# Patient Record
Sex: Female | Born: 1955
Health system: Southern US, Community
[De-identification: ages and names within clinical notes are randomized; demographics above are authoritative.]

## PROBLEM LIST (undated history)

## (undated) DIAGNOSIS — E119 Type 2 diabetes mellitus without complications: Secondary | ICD-10-CM

## (undated) DIAGNOSIS — E785 Hyperlipidemia, unspecified: Secondary | ICD-10-CM

## (undated) DIAGNOSIS — I10 Essential (primary) hypertension: Secondary | ICD-10-CM

## (undated) HISTORY — PX: LAPAROSCOPIC GASTRIC BANDING: SHX1100

## (undated) HISTORY — PX: ABDOMINAL HYSTERECTOMY: SHX81

## (undated) HISTORY — DX: Hyperlipidemia, unspecified: E78.5

## (undated) HISTORY — DX: Type 2 diabetes mellitus without complications: E11.9

## (undated) HISTORY — DX: Essential (primary) hypertension: I10

## (undated) HISTORY — PX: ROTATOR CUFF REPAIR: SHX139

---

## 2009-04-27 ENCOUNTER — Ambulatory Visit (HOSPITAL_COMMUNITY): Admission: RE | Admit: 2009-04-27 | Discharge: 2009-04-27 | Payer: Self-pay | Admitting: Surgery

## 2009-05-02 ENCOUNTER — Ambulatory Visit (HOSPITAL_COMMUNITY): Admission: RE | Admit: 2009-05-02 | Discharge: 2009-05-02 | Payer: Self-pay | Admitting: Surgery

## 2009-06-07 ENCOUNTER — Encounter: Admission: RE | Admit: 2009-06-07 | Discharge: 2009-06-07 | Payer: Self-pay | Admitting: Surgery

## 2009-08-25 ENCOUNTER — Encounter: Admission: RE | Admit: 2009-08-25 | Discharge: 2009-11-23 | Payer: Self-pay | Admitting: Surgery

## 2009-09-06 ENCOUNTER — Ambulatory Visit (HOSPITAL_COMMUNITY): Admission: RE | Admit: 2009-09-06 | Discharge: 2009-09-07 | Payer: Self-pay | Admitting: Surgery

## 2009-12-14 ENCOUNTER — Encounter: Admission: RE | Admit: 2009-12-14 | Discharge: 2010-03-14 | Payer: Self-pay | Admitting: Surgery

## 2010-03-15 ENCOUNTER — Encounter: Admission: RE | Admit: 2010-03-15 | Discharge: 2010-03-15 | Payer: Self-pay | Admitting: Surgery

## 2011-03-01 LAB — DIFFERENTIAL
Basophils Absolute: 0 10*3/uL (ref 0.0–0.1)
Basophils Relative: 0 % (ref 0–1)
Eosinophils Absolute: 0.1 10*3/uL (ref 0.0–0.7)
Eosinophils Relative: 1 % (ref 0–5)
Lymphocytes Relative: 32 % (ref 12–46)
Lymphocytes Relative: 34 % (ref 12–46)
Monocytes Relative: 7 % (ref 3–12)
Neutro Abs: 4.7 10*3/uL (ref 1.7–7.7)
Neutrophils Relative %: 58 % (ref 43–77)
Neutrophils Relative %: 59 % (ref 43–77)

## 2011-03-01 LAB — CBC
HCT: 38.2 % (ref 36.0–46.0)
MCHC: 34 g/dL (ref 30.0–36.0)
MCV: 83 fL (ref 78.0–100.0)
Platelets: 203 10*3/uL (ref 150–400)
Platelets: 249 10*3/uL (ref 150–400)
RBC: 4.61 MIL/uL (ref 3.87–5.11)
RBC: 4.94 MIL/uL (ref 3.87–5.11)
RDW: 14.1 % (ref 11.5–15.5)
WBC: 8.2 10*3/uL (ref 4.0–10.5)
WBC: 8.8 10*3/uL (ref 4.0–10.5)

## 2011-03-01 LAB — GLUCOSE, CAPILLARY
Glucose-Capillary: 130 mg/dL — ABNORMAL HIGH (ref 70–99)
Glucose-Capillary: 99 mg/dL (ref 70–99)

## 2011-03-01 LAB — COMPREHENSIVE METABOLIC PANEL
ALT: 41 U/L — ABNORMAL HIGH (ref 0–35)
Alkaline Phosphatase: 71 U/L (ref 39–117)
BUN: 24 mg/dL — ABNORMAL HIGH (ref 6–23)
CO2: 25 mEq/L (ref 19–32)
GFR calc Af Amer: >60 mL/min (ref >60)
GFR calc non Af Amer: >60 mL/min (ref >60)
Glucose, Bld: 83 mg/dL (ref 70–99)
Sodium: 136 mEq/L (ref 135–145)

## 2012-02-25 ENCOUNTER — Telehealth (INDEPENDENT_AMBULATORY_CARE_PROVIDER_SITE_OTHER): Payer: Self-pay | Admitting: Surgery

## 2012-02-25 NOTE — Telephone Encounter (Signed)
02/25/12 recall letter mailed to patient for bariatric surgery follow-up. Advised patient to call [facility] @ [phone] to schedule an appointment...cef 

## 2013-07-03 ENCOUNTER — Telehealth (INDEPENDENT_AMBULATORY_CARE_PROVIDER_SITE_OTHER): Payer: Self-pay | Admitting: Surgery

## 2013-07-03 NOTE — Telephone Encounter (Signed)
07/03/13 left message and mailed recall letter to make a bariatric follow-up appt with Dr. Ezzard Standing. Lap band placed on 09/06/09.ls

## 2013-08-25 ENCOUNTER — Encounter (INDEPENDENT_AMBULATORY_CARE_PROVIDER_SITE_OTHER): Payer: Self-pay

## 2015-11-29 DIAGNOSIS — H6091 Unspecified otitis externa, right ear: Secondary | ICD-10-CM | POA: Diagnosis not present

## 2015-11-29 DIAGNOSIS — L040 Acute lymphadenitis of face, head and neck: Secondary | ICD-10-CM | POA: Diagnosis not present

## 2015-11-29 DIAGNOSIS — Z6835 Body mass index (BMI) 35.0-35.9, adult: Secondary | ICD-10-CM | POA: Diagnosis not present

## 2015-11-29 MED FILL — AZITHROMYCIN 250 MG TABLET: 250 | 5 days supply | Qty: 6 | Fill #0

## 2015-12-01 MED FILL — NEO/POLYMYXIN/HC EAR SOLN: 3.5-10000-1 | 15 days supply | Qty: 10 | Fill #0

## 2015-12-08 DIAGNOSIS — T50905A Adverse effect of unspecified drugs, medicaments and biological substances, initial encounter: Secondary | ICD-10-CM | POA: Diagnosis not present

## 2015-12-08 DIAGNOSIS — Z6834 Body mass index (BMI) 34.0-34.9, adult: Secondary | ICD-10-CM | POA: Diagnosis not present

## 2015-12-09 MED FILL — LANTUS SOLOSTAR 100 UNITS/M: 100 | 78 days supply | Qty: 45 | Fill #2

## 2015-12-09 MED FILL — ULTICARE PEN NDL 8MM 31G: 31G X 8 MM | 90 days supply | Qty: 100 | Fill #0

## 2015-12-20 DIAGNOSIS — L404 Guttate psoriasis: Secondary | ICD-10-CM | POA: Diagnosis not present

## 2015-12-20 DIAGNOSIS — L821 Other seborrheic keratosis: Secondary | ICD-10-CM | POA: Diagnosis not present

## 2015-12-20 DIAGNOSIS — L579 Skin changes due to chronic exposure to nonionizing radiation, unspecified: Secondary | ICD-10-CM | POA: Diagnosis not present

## 2015-12-20 DIAGNOSIS — L814 Other melanin hyperpigmentation: Secondary | ICD-10-CM | POA: Diagnosis not present

## 2016-01-19 MED FILL — metFORMIN HCL 500 MG TABS: 500 | 90 days supply | Qty: 90 | Fill #0

## 2016-01-19 MED FILL — CARVEDILOL 12.5 MG TABLET: 12.5 | 90 days supply | Qty: 180 | Fill #0

## 2016-01-19 MED FILL — INVOKANA 300 MG TABLET: 300 | 30 days supply | Qty: 30 | Fill #2

## 2016-02-01 DIAGNOSIS — H90A31 Mixed conductive and sensorineural hearing loss, unilateral, right ear with restricted hearing on the contralateral side: Secondary | ICD-10-CM | POA: Diagnosis not present

## 2016-02-01 DIAGNOSIS — B369 Superficial mycosis, unspecified: Secondary | ICD-10-CM | POA: Diagnosis not present

## 2016-02-01 DIAGNOSIS — H90A22 Sensorineural hearing loss, unilateral, left ear, with restricted hearing on the contralateral side: Secondary | ICD-10-CM | POA: Diagnosis not present

## 2016-02-01 DIAGNOSIS — H608X3 Other otitis externa, bilateral: Secondary | ICD-10-CM | POA: Diagnosis not present

## 2016-02-01 DIAGNOSIS — H9071 Mixed conductive and sensorineural hearing loss, unilateral, right ear, with unrestricted hearing on the contralateral side: Secondary | ICD-10-CM | POA: Diagnosis not present

## 2016-02-06 DIAGNOSIS — B029 Zoster without complications: Secondary | ICD-10-CM | POA: Diagnosis not present

## 2016-02-06 DIAGNOSIS — Z6835 Body mass index (BMI) 35.0-35.9, adult: Secondary | ICD-10-CM | POA: Diagnosis not present

## 2016-02-06 MED FILL — valACYclovir HCL 1 GM TABS: 1 | 7 days supply | Qty: 21 | Fill #0

## 2016-03-26 MED FILL — ULTICARE PEN NDL 8MM 31G: 31G X 8 MM | 90 days supply | Qty: 100 | Fill #1

## 2016-03-26 MED FILL — LANTUS SOLOSTAR 100 UNITS/M: 100 | 78 days supply | Qty: 45 | Fill #3

## 2016-03-29 ENCOUNTER — Other Ambulatory Visit: Payer: Self-pay | Admitting: Surgery

## 2016-03-29 ENCOUNTER — Ambulatory Visit
Admission: RE | Admit: 2016-03-29 | Discharge: 2016-03-29 | Disposition: A | Discharge status: Home / Self Care | Payer: 59 | Source: Ambulatory Visit | Attending: Surgery | Admitting: Surgery

## 2016-03-29 DIAGNOSIS — R1013 Epigastric pain: Secondary | ICD-10-CM | POA: Diagnosis not present

## 2016-03-29 DIAGNOSIS — Z4651 Encounter for fitting and adjustment of gastric lap band: Secondary | ICD-10-CM

## 2016-03-29 DIAGNOSIS — Z9884 Bariatric surgery status: Secondary | ICD-10-CM | POA: Diagnosis not present

## 2016-04-18 MED FILL — INVOKANA 300 MG TABLET: 300 | 30 days supply | Qty: 30 | Fill #3

## 2016-04-19 MED FILL — RAMIPRIL 10 MG CAPSULE: 10 | 90 days supply | Qty: 180 | Fill #0

## 2016-04-19 MED FILL — metFORMIN HCL 500 MG TABS: 500 | 90 days supply | Qty: 90 | Fill #0

## 2016-04-26 ENCOUNTER — Encounter: Payer: 59 | Attending: Surgery | Admitting: Dietician

## 2016-04-26 ENCOUNTER — Encounter: Payer: Self-pay | Admitting: Dietician

## 2016-04-26 VITALS — Ht 62.0 in | Wt 185.6 lb

## 2016-04-26 DIAGNOSIS — E669 Obesity, unspecified: Secondary | ICD-10-CM

## 2016-04-26 DIAGNOSIS — Z9884 Bariatric surgery status: Secondary | ICD-10-CM | POA: Diagnosis not present

## 2016-04-26 DIAGNOSIS — Z713 Dietary counseling and surveillance: Secondary | ICD-10-CM | POA: Insufficient documentation

## 2016-04-26 NOTE — Progress Notes (Signed)
Medical Nutrition Therapy:  Appt start time: 0800 end time:  0840.   Assessment:  Primary concerns today: Melanie Sweeney is here today since she had the lap band in 2010. Lost down to 160 lbs (about 40 lbs). Current weight is 185.6 lbs and started gaining weight back when she started eating "badly" a few years ago. Husband likes to keep a lot of junk food around. Stopped getting follow ups with [facility] about 5 years ago. Had a problem with reflux recently after a stomach bug. Drinks a lot of coffee. Still feels restriction in her lap band and will throw up if she eats too much. Has another follow up with Dr. Lucia Gaskins in July and did not get a fill recently. Has a strong hx of heart disease which she would like to avoid.   Since meeting with Dr. Lucia Gaskins has started walk and follow 56 day diet plan (low carb) about 2 weeks ago.Has lost about 6 lbs since starting diet plan. Has cut out chocolate, sweets, bread, pasta, and diet Dr. Malachi Bonds.   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 Lives with her husband. Not skipping meals at this point. Eats out about 1 x week.   Needs to stay away from bread and shredded BBQ meat to avoid issues with lap band. Feels hungriest between 1 and 3 PM. Not hungry in the morning. Feels like she chews well. Just started back to waiting 30 minutes to drink about after meals. Not taking vitamins.   Preferred Learning Style:   No preference indicated   Learning Readiness:   Ready   MEDICATIONS: see list   DIETARY INTAKE:  Usual eating pattern includes 3 meals and 2-3 snacks per day.  Avoided foods include: liver, hot dogs, shrimp, lamb  24-hr recall:  B ( AM): Atkins shake and apple and sometimes with 4 pieces of [place] bacon Snk ( AM): apple with PB2 or unsweet applesauce or triple zero yogurt L ( PM): salad mix sometimes with with chicken or quinoa Snk ( PM): 28 almonds D ( PM): hamburger, chicken, or fish with quinoa and tomatoes or green beans Snk ( PM): none or yogurt Beverages:  2-3 cups coffee with stevia and spoonful of coconut oil, 60 oz+water, unsweet tea (trying to get 64 oz of water)  Usual physical activity: walking 5-6 x week for about 20 minutes  Estimated energy needs: 1200 calories  Progress Towards Goal(s):  In progress.   Nutritional Diagnosis:  [place]-3.3 Overweight/obesity related to past poor dietary habits and physical inactivity as evidenced by patient w/hx of LAGB surgery following dietary guidelines for continued weight loss.    Intervention:  Nutrition counseling provided. Plan Other sugar free drink ideas: Twist, crystal light, flavor drops, Powerade Zero, Diet juices - Ocean Spray or V8 Splash (says Diet and 5-10 calories). Eat protein first at eat meal - about 3 oz/size of deck of cards or palm of your hand.  Next each non starchy vegetables (as much as you want). Keep carbs to minimum (eat after protein and vegetables). 1/2 cup quinoa Continue to have fruit with protein (nuts, PB2, yogurt) for snack if you are hungry. Continue to walk as tolerated. Look into another exercise option if hip hurts.([facility])  Continue working on waiting 30 minutes to drink after meals. Start taking a chewable Flintstones Complete Multivitamin. Look into taking a chewable calcium and Vitamin D (1000 IUs). Have doctor check vitamin to see if you need the megadose.  Add biotin or hair, skin,  nails vitamin to help with hair.      Teaching Method Utilized:  Visual Auditory Hands on  Handouts given during visit include:  Protein shakes  Barriers to learning/adherence to lifestyle change: none  Demonstrated degree of understanding via:  Teach Back   Monitoring/Evaluation:  Dietary intake, exercise, and body weight in 2 month(s).

## 2016-04-26 NOTE — Patient Instructions (Addendum)
Other sugar free drink ideas: Twist, crystal light, flavor drops, Powerade Zero, Diet juices - Ocean Spray or V8 Splash (says Diet and 5-10 calories). Eat protein first at eat meal - about 3 oz/size of deck of cards or palm of your hand.  Next each non starchy vegetables (as much as you want). Keep carbs to minimum (eat after protein and vegetables). 1/2 cup quinoa Continue to have fruit with protein (nuts, PB2, yogurt) for snack if you are hungry. Continue to walk as tolerated. Look into another exercise option if hip hurts.([facility])  Continue working on waiting 30 minutes to drink after meals. Start taking a chewable Flintstones Complete Multivitamin. Look into taking a chewable calcium and Vitamin D (1000 IUs). Have doctor check vitamin to see if you need the megadose.  Add biotin or hair, skin, nails vitamin to help with hair.

## 2016-05-02 DIAGNOSIS — E1165 Type 2 diabetes mellitus with hyperglycemia: Secondary | ICD-10-CM | POA: Diagnosis not present

## 2016-05-02 DIAGNOSIS — Z794 Long term (current) use of insulin: Secondary | ICD-10-CM | POA: Diagnosis not present

## 2016-05-02 DIAGNOSIS — E559 Vitamin D deficiency, unspecified: Secondary | ICD-10-CM | POA: Diagnosis not present

## 2016-05-03 MED FILL — ONE TOUCH DELICA 33G LANCET: 50 days supply | Qty: 100 | Fill #0

## 2016-05-03 MED FILL — ONE TOUCH ULTRA 2 GLUCOSE S: W/DEVICE | 30 days supply | Qty: 1 | Fill #0

## 2016-05-04 MED FILL — VIT D2 1.25 MG (50,000 UNIT: 1.25 MG | 56 days supply | Qty: 8 | Fill #0

## 2016-05-24 ENCOUNTER — Emergency Department (INDEPENDENT_AMBULATORY_CARE_PROVIDER_SITE_OTHER): Payer: 59

## 2016-05-24 ENCOUNTER — Encounter: Payer: Self-pay | Admitting: *Deleted

## 2016-05-24 ENCOUNTER — Emergency Department
Admission: EM | Admit: 2016-05-24 | Discharge: 2016-05-24 | Disposition: A | Discharge status: Home / Self Care | Payer: 59 | Source: Home / Self Care

## 2016-05-24 DIAGNOSIS — M79672 Pain in left foot: Secondary | ICD-10-CM | POA: Diagnosis not present

## 2016-05-24 DIAGNOSIS — S9032XA Contusion of left foot, initial encounter: Secondary | ICD-10-CM

## 2016-05-24 DIAGNOSIS — M25475 Effusion, left foot: Secondary | ICD-10-CM | POA: Diagnosis not present

## 2016-05-24 DIAGNOSIS — M7989 Other specified soft tissue disorders: Secondary | ICD-10-CM | POA: Diagnosis not present

## 2016-05-24 NOTE — Discharge Instructions (Signed)
Apply ice pack for 20 to 30 minutes every 2 to 3 hours today and tomorrow.  Elevate.   Wear Ace wrap until swelling decreases.  Wear brace for about 2 to 3 weeks.  May take Aleve, 2 tabs every 12 hours with food.   Foot Contusion A foot contusion is a deep bruise to the foot. Contusions are the result of an injury that caused bleeding under the skin. The contusion may turn blue, purple, or yellow. Minor injuries will give you a painless contusion, but more severe contusions may stay painful and swollen for a few weeks. CAUSES  A foot contusion comes from a direct blow to that area, such as a heavy object falling on the foot. SYMPTOMS   Swelling of the foot.  Discoloration of the foot.  Tenderness or soreness of the foot. DIAGNOSIS  You will have a physical exam and will be asked about your history. You may need an X-ray of your foot to look for a broken bone (fracture).  TREATMENT  An elastic wrap may be recommended to support your foot. Resting, elevating, and applying cold compresses to your foot are often the best treatments for a foot contusion. Over-the-counter medicines may also be recommended for pain control. HOME CARE INSTRUCTIONS   Put ice on the injured area.  Put ice in a plastic bag.  Place a towel between your skin and the bag.  Leave the ice on for 15-20 minutes, 03-04 times a day.  Only take over-the-counter or prescription medicines for pain, discomfort, or fever as directed by your caregiver.  If told, use an elastic wrap as directed. This can help reduce swelling. You may remove the wrap for sleeping, showering, and bathing. If your toes become numb, cold, or blue, take the wrap off and reapply it more loosely.  Elevate your foot with pillows to reduce swelling.  Try to avoid standing or walking while the foot is painful. Do not resume use until instructed by your caregiver. Then, begin use gradually. If pain develops, decrease use. Gradually increase activities  that do not cause discomfort until you have normal use of your foot.  See your caregiver as directed. It is very important to keep all follow-up appointments in order to avoid any lasting problems with your foot, including long-term (chronic) pain. SEEK IMMEDIATE MEDICAL CARE IF:   You have increased redness, swelling, or pain in your foot.  Your swelling or pain is not relieved with medicines.  You have loss of feeling in your foot or are unable to move your toes.  Your foot turns cold or blue.  You have pain when you move your toes.  Your foot becomes warm to the touch.  Your contusion does not improve in 2 days. MAKE SURE YOU:   Understand these instructions.  Will watch your condition.  Will get help right away if you are not doing well or get worse.   This information is not intended to replace advice given to you by your health care provider. Make sure you discuss any questions you have with your health care provider.   Document Released: 09/03/2006 Document Revised: 05/13/2012 Document Reviewed: 07/19/2015 Elsevier Interactive Patient Education [employer].

## 2016-05-24 NOTE — ED Notes (Signed)
Pt dropped jar of sauce on left foot last night. Pain @ base of great toe, redness/swelling. Previous injury to another part of her left foot.

## 2016-05-24 NOTE — ED Provider Notes (Signed)
CSN: [id number]     Arrival date & time 05/24/16  1652 History   None    Chief Complaint  Patient presents with  . Foot Injury      HPI Comments: Patient dropped a jar of sauce on the dorsum of her left foot last night, and has had persistent pain.  Patient is a 60 y.o. female presenting with foot injury. The history is provided by the patient.  Foot Injury Location:  Foot Time since incident:  1 day Injury: yes   Mechanism of injury comment:  Contusion Foot location:  L foot Pain details:    Quality:  Aching   Radiates to:  Does not radiate   Severity:  Moderate   Onset quality:  Sudden   Duration:  1 day   Timing:  Constant   Progression:  Unchanged Chronicity:  New Prior injury to area:  Yes Relieved by:  None tried Worsened by:  Bearing weight Ineffective treatments:  None tried Associated symptoms: stiffness and swelling   Associated symptoms: no decreased ROM, no muscle weakness, no numbness and no tingling     Past Medical History  Diagnosis Date  . Diabetes mellitus without complication ([place])   . Hypertension   . Hyperlipidemia    Past Surgical History  Procedure Laterality Date  . Laparoscopic gastric banding    . Abdominal hysterectomy    . Rotator cuff repair     Family History  Problem Relation Age of Onset  . Hypertension Mother   . Diabetes Mother   . Stroke Mother   . Diabetes Father   . Hypertension Father   . Cancer Father     Lung Ca  . Lymphoma Father    Social History  Substance Use Topics  . Smoking status: Never Smoker   . Smokeless tobacco: None  . Alcohol Use: No   OB History    No data available     Review of Systems  Musculoskeletal: Positive for stiffness.    Allergies  Iodine and Shrimp  Home Medications   Prior to Admission medications   Medication Sig Start Date End Date Taking? Authorizing Provider  canagliflozin (INVOKANA) 300 MG TABS tablet Take 300 mg by mouth daily before breakfast.    Historical Provider,  MD  carvedilol (COREG) 12.5 MG tablet Take 12.5 mg by mouth 2 (two) times daily with a meal.    Historical Provider, MD  insulin glargine (LANTUS) 100 UNIT/ML injection Inject into the skin at bedtime.    Historical Provider, MD  ramipril (ALTACE) 10 MG capsule Take 10 mg by mouth daily.    Historical Provider, MD  rosuvastatin (CRESTOR) 10 MG tablet Take 10 mg by mouth daily.    Historical Provider, MD   Meds Ordered and Administered this Visit  Medications - No data to display  BP 170/71 mmHg  Pulse 75  Ht 5\' 2"  (1.575 m)  Wt 186 lb (84.369 kg)  BMI 34.01 kg/m2  SpO2 98% No data found.   Physical Exam  Constitutional: She is oriented to person, place, and time. She appears well-developed and well-nourished. No distress.  HENT:  Head: Atraumatic.  Eyes: Conjunctivae are normal. Pupils are equal, round, and reactive to light.  Neck: Neck supple.  Cardiovascular: Normal heart sounds.   Pulmonary/Chest: Breath sounds normal.  Musculoskeletal:       Left foot: There is tenderness, bony tenderness and swelling. There is normal capillary refill, no crepitus, no deformity and no laceration.  Feet:  Left foot has tenderness to palpation over dorsum  as noted on diagram.    Neurological: She is alert and oriented to person, place, and time.  Skin: Skin is warm and dry.  Nursing note and vitals reviewed.   ED Course  Procedures none  Imaging Review Dg Foot Complete Left  05/24/2016  CLINICAL DATA:  Initial encounter. 60 y/o female s/p injury to LEFT foot last night, dropped a can of food on it. Intermittent pain at distal 1st metatarsal/proximal phalanx of great toe. EXAM: LEFT FOOT - COMPLETE 3+ VIEW COMPARISON:  None. FINDINGS: No fracture or dislocation of mid foot or forefoot. The phalanges are normal. The calcaneus is normal. Soft tissue swelling over the dorsum of foot. IMPRESSION: 1. No evidence fracture. 2. Soft tissue swelling over the dorsum foot. Electronically Signed    By: Suzy Bouchard M.D.   On: 05/24/2016 17:34      MDM   1. Contusion of left foot, initial encounter    Ace wrap applied. Apply ice pack for 20 to 30 minutes every 2 to 3 hours today and tomorrow.  Elevate.   Wear Ace wrap until swelling decreases.  Wear brace for about 2 to 3 weeks.  May take Aleve, 2 tabs every 12 hours with food. Followup with Dr. Aundria Mems or Dr. Lynne Leader ([facility]) if not improving about two weeks.     Kandra Nicolas, MD 05/26/16 [phone]

## 2016-06-01 DIAGNOSIS — Z9884 Bariatric surgery status: Secondary | ICD-10-CM | POA: Diagnosis not present

## 2016-06-26 ENCOUNTER — Ambulatory Visit: Payer: 59 | Admitting: Dietician

## 2016-06-29 DIAGNOSIS — E782 Mixed hyperlipidemia: Secondary | ICD-10-CM | POA: Diagnosis not present

## 2016-06-29 DIAGNOSIS — Z1159 Encounter for screening for other viral diseases: Secondary | ICD-10-CM | POA: Diagnosis not present

## 2016-06-29 DIAGNOSIS — M810 Age-related osteoporosis without current pathological fracture: Secondary | ICD-10-CM | POA: Diagnosis not present

## 2016-06-29 DIAGNOSIS — I10 Essential (primary) hypertension: Secondary | ICD-10-CM | POA: Diagnosis not present

## 2016-06-29 DIAGNOSIS — Z1231 Encounter for screening mammogram for malignant neoplasm of breast: Secondary | ICD-10-CM | POA: Diagnosis not present

## 2016-06-29 DIAGNOSIS — Z23 Encounter for immunization: Secondary | ICD-10-CM | POA: Diagnosis not present

## 2016-07-05 ENCOUNTER — Ambulatory Visit: Payer: 59 | Admitting: Dietician

## 2016-07-06 MED FILL — RAMIPRIL 10 MG CAPSULE: 10 | 90 days supply | Qty: 90 | Fill #0

## 2016-07-06 MED FILL — CARVEDILOL 12.5 MG TABLET: 12.5 | 90 days supply | Qty: 90 | Fill #0

## 2016-07-09 MED FILL — LANTUS SOLOSTAR 100 UNITS/M: 100 | 86 days supply | Qty: 36 | Fill #0

## 2016-08-03 MED FILL — METFORMIN HCL ER 500 MG TAB: 500 | 90 days supply | Qty: 90 | Fill #0

## 2016-08-15 DIAGNOSIS — M8589 Other specified disorders of bone density and structure, multiple sites: Secondary | ICD-10-CM | POA: Diagnosis not present

## 2016-08-15 DIAGNOSIS — M899 Disorder of bone, unspecified: Secondary | ICD-10-CM | POA: Diagnosis not present

## 2016-08-30 MED FILL — ALENDRONATE NA 70 MG TAB: 70 | 84 days supply | Qty: 12 | Fill #0

## 2016-09-28 MED FILL — LANTUS SOLOSTAR 100 UNITS/M: 100 | 86 days supply | Qty: 36 | Fill #1

## 2016-11-08 MED FILL — METFORMIN HCL ER 500 MG TAB: 500 | 90 days supply | Qty: 90 | Fill #1

## 2016-11-23 MED FILL — INVOKANA 300 MG TABLET: 300 | 30 days supply | Qty: 30 | Fill #0

## 2016-12-05 DIAGNOSIS — J40 Bronchitis, not specified as acute or chronic: Secondary | ICD-10-CM | POA: Diagnosis not present

## 2016-12-06 MED FILL — VENTOLIN HFA 90 MCG INHALER: 108 (90 BAS | 17 days supply | Qty: 18 | Fill #0

## 2016-12-10 MED FILL — LANTUS SOLOSTAR 100 UNITS/M: 100 | 43 days supply | Qty: 18 | Fill #2

## 2016-12-11 MED FILL — AMOX-CLAV 875-125 MG TABLET: 875-125 | 10 days supply | Qty: 20 | Fill #0

## 2016-12-27 MED FILL — INVOKANA 300 MG TABLET: 300 | 30 days supply | Qty: 30 | Fill #1

## 2017-01-07 DIAGNOSIS — Z794 Long term (current) use of insulin: Secondary | ICD-10-CM | POA: Diagnosis not present

## 2017-01-07 DIAGNOSIS — E559 Vitamin D deficiency, unspecified: Secondary | ICD-10-CM | POA: Diagnosis not present

## 2017-01-07 DIAGNOSIS — Z6835 Body mass index (BMI) 35.0-35.9, adult: Secondary | ICD-10-CM | POA: Diagnosis not present

## 2017-01-07 DIAGNOSIS — E6609 Other obesity due to excess calories: Secondary | ICD-10-CM | POA: Diagnosis not present

## 2017-01-07 DIAGNOSIS — E1165 Type 2 diabetes mellitus with hyperglycemia: Secondary | ICD-10-CM | POA: Diagnosis not present

## 2017-01-10 DIAGNOSIS — D1801 Hemangioma of skin and subcutaneous tissue: Secondary | ICD-10-CM | POA: Diagnosis not present

## 2017-01-10 DIAGNOSIS — L814 Other melanin hyperpigmentation: Secondary | ICD-10-CM | POA: Diagnosis not present

## 2017-01-10 DIAGNOSIS — D225 Melanocytic nevi of trunk: Secondary | ICD-10-CM | POA: Diagnosis not present

## 2017-01-10 DIAGNOSIS — L821 Other seborrheic keratosis: Secondary | ICD-10-CM | POA: Diagnosis not present

## 2017-01-10 DIAGNOSIS — L579 Skin changes due to chronic exposure to nonionizing radiation, unspecified: Secondary | ICD-10-CM | POA: Diagnosis not present

## 2017-01-10 DIAGNOSIS — L400 Psoriasis vulgaris: Secondary | ICD-10-CM | POA: Diagnosis not present

## 2017-01-10 DIAGNOSIS — D2371 Other benign neoplasm of skin of right lower limb, including hip: Secondary | ICD-10-CM | POA: Diagnosis not present

## 2017-01-10 MED FILL — DESONIDE 0.05% OINTMENT: 0.05 | 20 days supply | Qty: 30 | Fill #0

## 2017-01-15 MED FILL — ATORVASTATIN 20 MG TABLET: 20 | 35 days supply | Qty: 15 | Fill #0

## 2017-01-22 DIAGNOSIS — I10 Essential (primary) hypertension: Secondary | ICD-10-CM | POA: Diagnosis not present

## 2017-01-22 DIAGNOSIS — E785 Hyperlipidemia, unspecified: Secondary | ICD-10-CM | POA: Diagnosis not present

## 2017-01-22 DIAGNOSIS — Z23 Encounter for immunization: Secondary | ICD-10-CM | POA: Diagnosis not present

## 2017-01-28 MED FILL — LANTUS SOLOSTAR 100 UNITS/M: 100 | 36 days supply | Qty: 15 | Fill #0

## 2017-01-28 MED FILL — METFORMIN HCL ER 500 MG TAB: 500 | 90 days supply | Qty: 90 | Fill #2

## 2017-01-28 MED FILL — INVOKANA 300 MG TABLET: 300 | 30 days supply | Qty: 30 | Fill #2

## 2017-02-26 DIAGNOSIS — Z1231 Encounter for screening mammogram for malignant neoplasm of breast: Secondary | ICD-10-CM | POA: Diagnosis not present

## 2017-02-28 MED FILL — LANTUS SOLOSTAR 100 UNITS/M: 100 | 36 days supply | Qty: 15 | Fill #1

## 2017-02-28 MED FILL — INVOKANA 300 MG TABLET: 300 | 30 days supply | Qty: 30 | Fill #3

## 2017-03-27 MED FILL — INVOKANA 300 MG TABLET: 300 | 30 days supply | Qty: 30 | Fill #4

## 2017-04-09 MED FILL — LANTUS SOLOSTAR 100 UNITS/M: 100 | 36 days supply | Qty: 15 | Fill #2

## 2017-04-10 MED FILL — RAMIPRIL 10 MG CAPSULE: 10 | 90 days supply | Qty: 90 | Fill #0

## 2017-04-10 MED FILL — CARVEDILOL 12.5 MG TABLET: 12.5 | 90 days supply | Qty: 90 | Fill #0

## 2017-04-24 MED FILL — METFORMIN HCL ER 500 MG TAB: 500 | 90 days supply | Qty: 90 | Fill #3

## 2017-04-26 MED FILL — INVOKANA 300 MG TABLET: 300 | 30 days supply | Qty: 30 | Fill #5

## 2017-05-16 MED FILL — LANTUS SOLOSTAR 100 UNITS/M: 100 | 36 days supply | Qty: 15 | Fill #3

## 2017-05-30 MED FILL — INVOKANA 300 MG TABLET: 300 | 30 days supply | Qty: 30 | Fill #0

## 2017-06-13 DIAGNOSIS — L600 Ingrowing nail: Secondary | ICD-10-CM | POA: Diagnosis not present

## 2017-06-13 DIAGNOSIS — M21612 Bunion of left foot: Secondary | ICD-10-CM | POA: Diagnosis not present

## 2017-06-13 DIAGNOSIS — M21611 Bunion of right foot: Secondary | ICD-10-CM | POA: Diagnosis not present

## 2017-06-13 MED FILL — SULFAMETHOXAZOLE/TMP DS TAB: 800-160 | 10 days supply | Qty: 20 | Fill #0

## 2017-06-14 MED FILL — ULTICARE PEN NDL 8MM 31G: 31G X 8 MM | 90 days supply | Qty: 100 | Fill #0

## 2017-06-20 MED FILL — LANTUS SOLOSTAR 100 UNITS/M: 100 | 36 days supply | Qty: 15 | Fill #4

## 2017-06-26 ENCOUNTER — Ambulatory Visit: Payer: Self-pay | Admitting: Podiatry

## 2017-07-02 ENCOUNTER — Ambulatory Visit: Payer: Self-pay | Admitting: Podiatry

## 2017-07-03 MED FILL — CARVEDILOL 12.5 MG TABLET: 12.5 | 90 days supply | Qty: 90 | Fill #1

## 2017-07-03 MED FILL — RAMIPRIL 10 MG CAPSULE: 10 | 90 days supply | Qty: 90 | Fill #1

## 2017-07-03 MED FILL — INVOKANA 300 MG TABLET: 300 | 30 days supply | Qty: 30 | Fill #1

## 2017-07-16 DIAGNOSIS — L821 Other seborrheic keratosis: Secondary | ICD-10-CM | POA: Diagnosis not present

## 2017-07-16 DIAGNOSIS — D492 Neoplasm of unspecified behavior of bone, soft tissue, and skin: Secondary | ICD-10-CM | POA: Diagnosis not present

## 2017-07-16 DIAGNOSIS — L814 Other melanin hyperpigmentation: Secondary | ICD-10-CM | POA: Diagnosis not present

## 2017-07-16 DIAGNOSIS — R21 Rash and other nonspecific skin eruption: Secondary | ICD-10-CM | POA: Diagnosis not present

## 2017-07-16 DIAGNOSIS — L2089 Other atopic dermatitis: Secondary | ICD-10-CM | POA: Diagnosis not present

## 2017-07-16 DIAGNOSIS — L579 Skin changes due to chronic exposure to nonionizing radiation, unspecified: Secondary | ICD-10-CM | POA: Diagnosis not present

## 2017-07-16 MED FILL — FLUOCINONIDE 0.05% CREAM: 0.05 | 14 days supply | Qty: 30 | Fill #0

## 2017-07-17 ENCOUNTER — Ambulatory Visit (INDEPENDENT_AMBULATORY_CARE_PROVIDER_SITE_OTHER): Payer: 59 | Admitting: Podiatry

## 2017-07-17 ENCOUNTER — Encounter: Payer: Self-pay | Admitting: Podiatry

## 2017-07-17 VITALS — Ht 62.0 in | Wt 178.0 lb

## 2017-07-17 DIAGNOSIS — M2041 Other hammer toe(s) (acquired), right foot: Secondary | ICD-10-CM

## 2017-07-17 DIAGNOSIS — M67472 Ganglion, left ankle and foot: Secondary | ICD-10-CM

## 2017-07-17 DIAGNOSIS — M79671 Pain in right foot: Secondary | ICD-10-CM | POA: Diagnosis not present

## 2017-07-17 DIAGNOSIS — M79672 Pain in left foot: Secondary | ICD-10-CM

## 2017-07-17 NOTE — Patient Instructions (Signed)
Pain 4th toe right. Noted of curling under the 3rd digit right causing pain with weight bearing. May require surgical intervention. Noted of enlarged ganglionic cyst left foot dorsum. May benefit from compression. Metatarsal binder x 1, medium dispensed. Return for pre op.

## 2017-07-17 NOTE — Progress Notes (Signed)
SUBJECTIVE: 61 y.o. year old female presents complaining of painful toe 3rd right. Soft tissue mass over left foot due to injury 7 years ago. Has had multiple ankle sprain bilateral. Diabetic x 22 years. Blood sugar 94 yesterday. A1C was 7 on 06/18/17.  REVIEW OF SYSTEMS: A comprehensive review of systems was negative except for: Diabetic x 22 years, controlled.  OBJECTIVE: DERMATOLOGIC EXAMINATION: No abnormal findings.  VASCULAR EXAMINATION OF LOWER LIMBS: All pedal pulses are palpable with normal pulsation.  Capillary Filling times within 3 seconds in all digits.  No edema or erythema noted. Temperature gradient from tibial crest to dorsum of foot is within normal bilateral.  NEUROLOGIC EXAMINATION OF THE LOWER LIMBS: All epicritic and tactile sensations grossly intact. Sharp and Dull discriminatory sensations at the plantar ball of hallux is intact bilateral.   MUSCULOSKELETAL EXAMINATION: Positive for hypermobile first ray, enlarged metatarsal head, contracting lesser digits with varus rotated 4th toe right.  ASSESSMENT: Digital deformity, varus rotated 4th digit curling under the 3rd toe upon weight bearing. Bunion deformity bilateral. Hypermobile first ray bilateral. Ganglionic cyst dorsum left foot.  PLAN: Reviewed findings and available treatment options. Metatarsal binder dispensed for left foot cyst. May benefit from surgical intervention on 4th toe right. Return for pre op.

## 2017-07-25 MED FILL — LANTUS SOLOSTAR 100 UNITS/M: 100 | 36 days supply | Qty: 15 | Fill #5

## 2017-07-30 MED FILL — METFORMIN HCL ER 500 MG TAB: 500 | 90 days supply | Qty: 90 | Fill #0

## 2017-08-01 MED FILL — INVOKANA 300 MG TABLET: 300 | 30 days supply | Qty: 30 | Fill #2

## 2017-08-13 DIAGNOSIS — R319 Hematuria, unspecified: Secondary | ICD-10-CM | POA: Diagnosis not present

## 2017-08-13 DIAGNOSIS — N390 Urinary tract infection, site not specified: Secondary | ICD-10-CM | POA: Diagnosis not present

## 2017-08-13 MED FILL — CIPROFLOXACIN HCL 500 MG TA: 500 | 10 days supply | Qty: 20 | Fill #0

## 2017-08-23 DIAGNOSIS — R3121 Asymptomatic microscopic hematuria: Secondary | ICD-10-CM | POA: Diagnosis not present

## 2017-08-23 DIAGNOSIS — E559 Vitamin D deficiency, unspecified: Secondary | ICD-10-CM | POA: Diagnosis not present

## 2017-08-23 DIAGNOSIS — I10 Essential (primary) hypertension: Secondary | ICD-10-CM | POA: Diagnosis not present

## 2017-08-23 DIAGNOSIS — Z23 Encounter for immunization: Secondary | ICD-10-CM | POA: Diagnosis not present

## 2017-08-23 DIAGNOSIS — Z6834 Body mass index (BMI) 34.0-34.9, adult: Secondary | ICD-10-CM | POA: Diagnosis not present

## 2017-08-23 DIAGNOSIS — E782 Mixed hyperlipidemia: Secondary | ICD-10-CM | POA: Diagnosis not present

## 2017-08-23 DIAGNOSIS — R5383 Other fatigue: Secondary | ICD-10-CM | POA: Diagnosis not present

## 2017-08-23 DIAGNOSIS — Z0000 Encounter for general adult medical examination without abnormal findings: Secondary | ICD-10-CM | POA: Diagnosis not present

## 2017-08-23 DIAGNOSIS — E6609 Other obesity due to excess calories: Secondary | ICD-10-CM | POA: Diagnosis not present

## 2017-08-23 MED FILL — FLUCONAZOLE 150 MG TABLET: 150 | 2 days supply | Qty: 2 | Fill #0

## 2017-08-28 MED FILL — LANTUS SOLOSTAR 100 UNITS/M: 100 | 36 days supply | Qty: 15 | Fill #0

## 2017-09-04 MED FILL — INVOKANA 300 MG TABLET: 300 | 30 days supply | Qty: 30 | Fill #3

## 2017-09-16 DIAGNOSIS — E113292 Type 2 diabetes mellitus with mild nonproliferative diabetic retinopathy without macular edema, left eye: Secondary | ICD-10-CM | POA: Diagnosis not present

## 2017-09-16 DIAGNOSIS — H527 Unspecified disorder of refraction: Secondary | ICD-10-CM | POA: Diagnosis not present

## 2017-09-16 DIAGNOSIS — Z794 Long term (current) use of insulin: Secondary | ICD-10-CM | POA: Diagnosis not present

## 2017-09-16 DIAGNOSIS — H2513 Age-related nuclear cataract, bilateral: Secondary | ICD-10-CM | POA: Diagnosis not present

## 2017-09-23 DIAGNOSIS — Z6834 Body mass index (BMI) 34.0-34.9, adult: Secondary | ICD-10-CM | POA: Diagnosis not present

## 2017-09-23 DIAGNOSIS — E1165 Type 2 diabetes mellitus with hyperglycemia: Secondary | ICD-10-CM | POA: Diagnosis not present

## 2017-09-23 DIAGNOSIS — Z794 Long term (current) use of insulin: Secondary | ICD-10-CM | POA: Diagnosis not present

## 2017-09-23 DIAGNOSIS — E6609 Other obesity due to excess calories: Secondary | ICD-10-CM | POA: Diagnosis not present

## 2017-09-24 ENCOUNTER — Ambulatory Visit: Payer: 59 | Admitting: Podiatry

## 2017-10-04 MED FILL — INVOKANA 300 MG TABLET: 300 | 30 days supply | Qty: 30 | Fill #4

## 2017-10-04 MED FILL — CARVEDILOL 12.5 MG TABLET: 12.5 | 90 days supply | Qty: 90 | Fill #2

## 2017-10-04 MED FILL — LANTUS SOLOSTAR 100 UNITS/M: 100 | 36 days supply | Qty: 15 | Fill #1

## 2017-10-04 MED FILL — RAMIPRIL 10 MG CAPSULE: 10 | 90 days supply | Qty: 90 | Fill #2

## 2017-10-15 MED FILL — METFORMIN HCL ER 500 MG TAB: 500 | 90 days supply | Qty: 90 | Fill #1

## 2017-11-11 MED FILL — FLUCONAZOLE 100 MG TABLET: 100 | 3 days supply | Qty: 3 | Fill #0

## 2017-11-13 MED FILL — INVOKANA 300 MG TABLET: 300 | 30 days supply | Qty: 30 | Fill #5

## 2017-11-13 MED FILL — LANTUS SOLOSTAR 100 UNITS/M: 100 | 36 days supply | Qty: 15 | Fill #2

## 2017-12-16 DIAGNOSIS — Z794 Long term (current) use of insulin: Secondary | ICD-10-CM | POA: Diagnosis not present

## 2017-12-16 DIAGNOSIS — H2513 Age-related nuclear cataract, bilateral: Secondary | ICD-10-CM | POA: Diagnosis not present

## 2017-12-16 DIAGNOSIS — E113292 Type 2 diabetes mellitus with mild nonproliferative diabetic retinopathy without macular edema, left eye: Secondary | ICD-10-CM | POA: Diagnosis not present

## 2017-12-16 MED FILL — LANTUS SOLOSTAR 100 UNITS/M: 100 | 36 days supply | Qty: 15 | Fill #3

## 2017-12-17 MED FILL — MOMETASONE FUROATE 0.1% CRM: 0.1 | 14 days supply | Qty: 15 | Fill #0

## 2017-12-23 MED FILL — JARDIANCE 25 MG TABLET: 25 | 30 days supply | Qty: 30 | Fill #0

## 2017-12-26 MED FILL — CARVEDILOL 12.5 MG TABLET: 12.5 | 90 days supply | Qty: 90 | Fill #3

## 2018-01-10 MED FILL — METFORMIN HCL ER 500 MG TAB: 500 | 90 days supply | Qty: 90 | Fill #2

## 2018-01-10 MED FILL — RAMIPRIL 10 MG CAPSULE: 10 | 90 days supply | Qty: 90 | Fill #3

## 2018-01-26 IMAGING — CR DG ABDOMEN 1V
1 series · 1 of 1 positions shown · non-contrast
Comparison: Abdominal film September 07, 2009

CLINICAL DATA: Evaluate lap band status and placement; patient
reports reflux for the past 6 months with intermittent epigastric
pain.

EXAM:
ABDOMEN - 1 VIEW

[t abdomen supine]
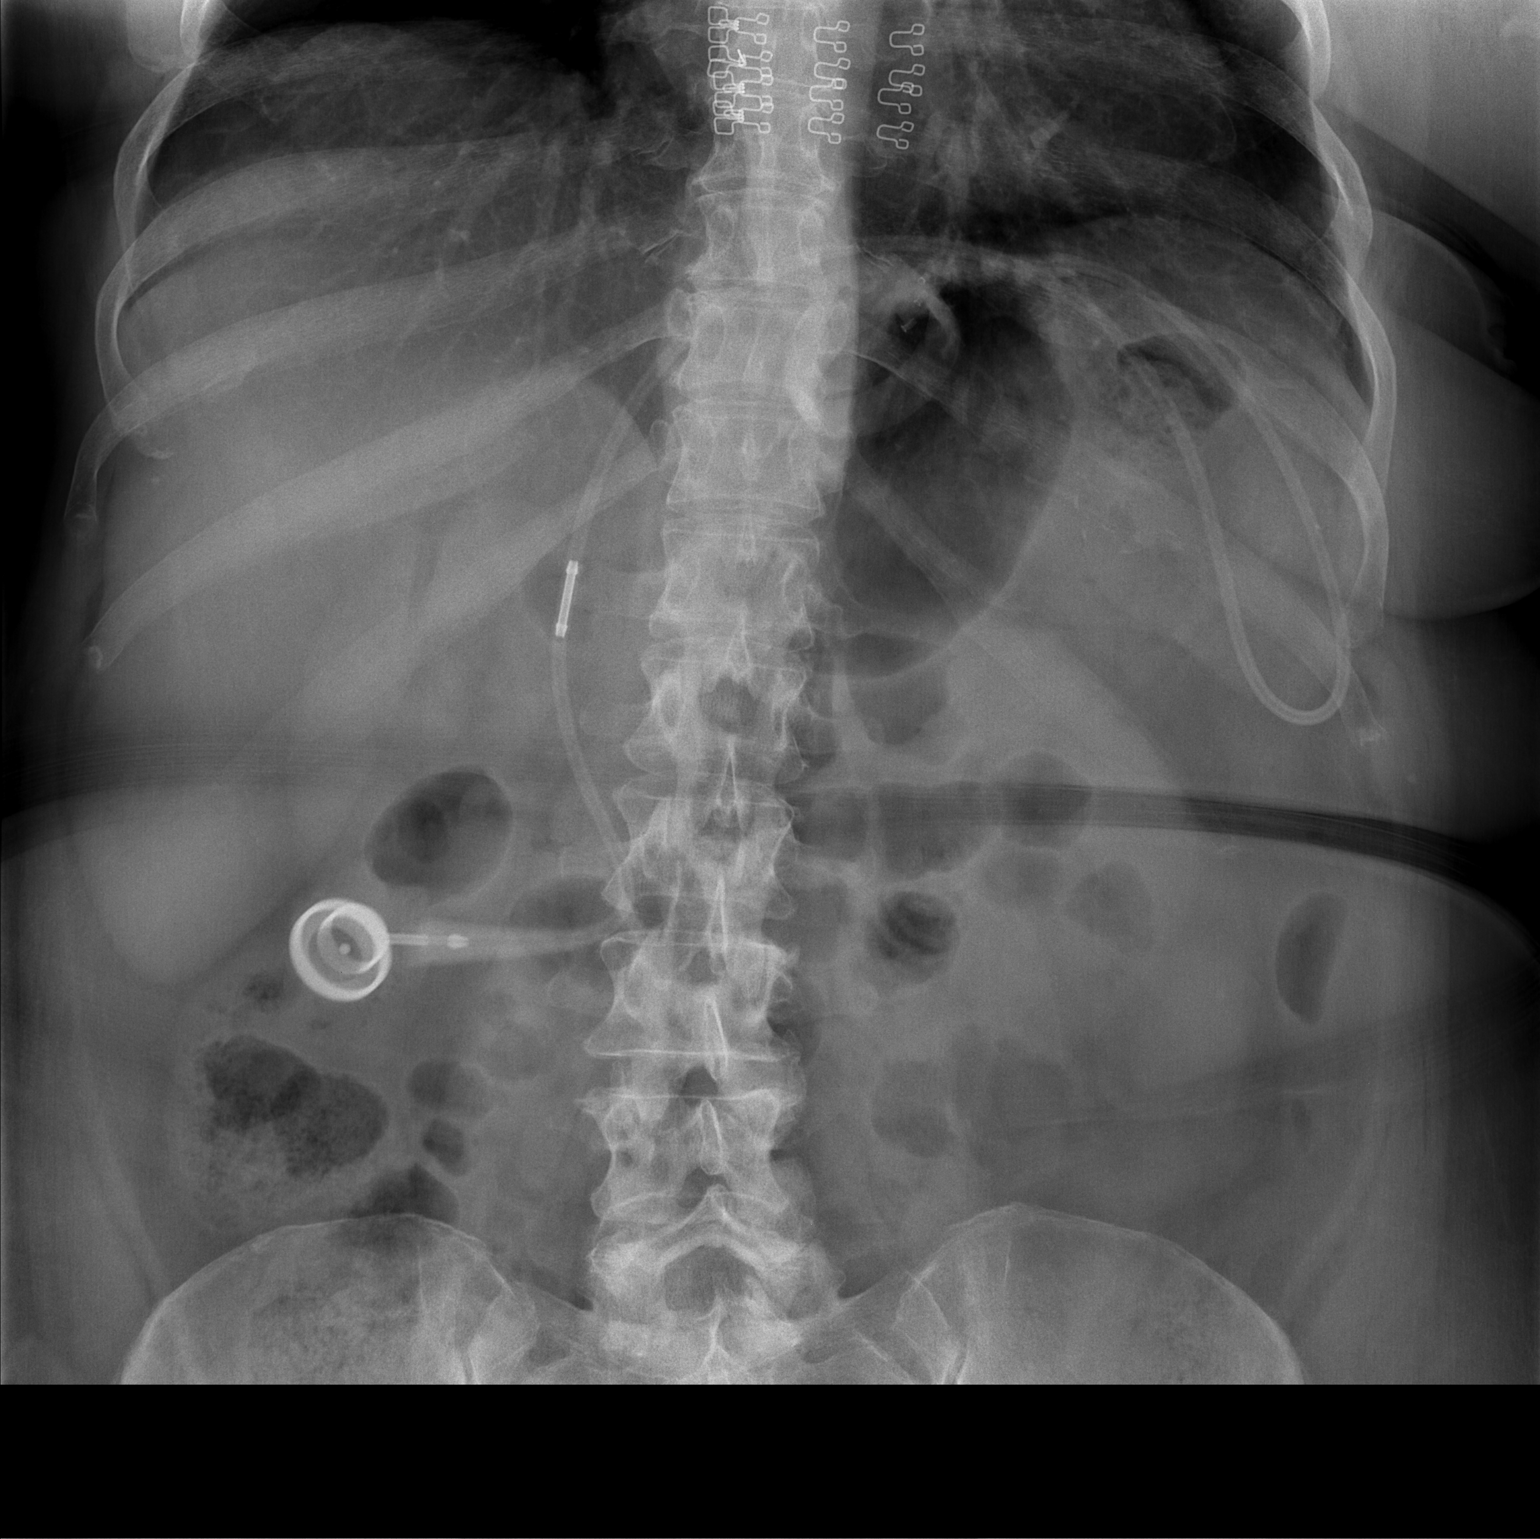

[1 of 1 positions shown; findings below may reference images not displayed]

FINDINGS: The laparoscopic band appears to be in stable position at the level
of the GE junction near the medial aspect of the left hemidiaphragm.
The stomach itself contains a moderate amount of gas but does not
appear abnormally distended. The lap band tubing and reservoir
appear to be in stable position. The bowel gas pattern is
unremarkable. The lung bases are clear. The bony structures are
unremarkable.
IMPRESSION: Appropriate radiographic positioning of the laparoscopic band. No
disruption of the connecting tubing or reservoir is observed.

## 2018-01-28 MED FILL — LANTUS SOLOSTAR 100 UNITS/M: 100 | 36 days supply | Qty: 15 | Fill #4

## 2018-01-28 MED FILL — JARDIANCE 25 MG TABLET: 25 | 30 days supply | Qty: 30 | Fill #1

## 2018-02-17 DIAGNOSIS — E1165 Type 2 diabetes mellitus with hyperglycemia: Secondary | ICD-10-CM | POA: Diagnosis not present

## 2018-02-17 DIAGNOSIS — B373 Candidiasis of vulva and vagina: Secondary | ICD-10-CM | POA: Diagnosis not present

## 2018-02-17 DIAGNOSIS — Z794 Long term (current) use of insulin: Secondary | ICD-10-CM | POA: Diagnosis not present

## 2018-02-17 MED FILL — FLUCONAZOLE 100 MG TABLET: 100 | 3 days supply | Qty: 3 | Fill #0

## 2018-02-24 DIAGNOSIS — Z6834 Body mass index (BMI) 34.0-34.9, adult: Secondary | ICD-10-CM | POA: Diagnosis not present

## 2018-02-24 DIAGNOSIS — Z23 Encounter for immunization: Secondary | ICD-10-CM | POA: Diagnosis not present

## 2018-02-24 DIAGNOSIS — E6609 Other obesity due to excess calories: Secondary | ICD-10-CM | POA: Diagnosis not present

## 2018-02-24 DIAGNOSIS — Z1231 Encounter for screening mammogram for malignant neoplasm of breast: Secondary | ICD-10-CM | POA: Diagnosis not present

## 2018-02-24 DIAGNOSIS — I10 Essential (primary) hypertension: Secondary | ICD-10-CM | POA: Diagnosis not present

## 2018-02-24 DIAGNOSIS — E559 Vitamin D deficiency, unspecified: Secondary | ICD-10-CM | POA: Diagnosis not present

## 2018-02-24 DIAGNOSIS — R5383 Other fatigue: Secondary | ICD-10-CM | POA: Diagnosis not present

## 2018-02-24 DIAGNOSIS — R82998 Other abnormal findings in urine: Secondary | ICD-10-CM | POA: Diagnosis not present

## 2018-02-24 DIAGNOSIS — E782 Mixed hyperlipidemia: Secondary | ICD-10-CM | POA: Diagnosis not present

## 2018-02-25 MED FILL — EZETIMIBE 10 MG TABLET: 10 | 30 days supply | Qty: 30 | Fill #0

## 2018-02-26 MED FILL — PENICILLIN VK 500 MG TABLET: 500 | 10 days supply | Qty: 30 | Fill #0

## 2018-02-28 MED FILL — LANTUS SOLOSTAR 100 UNITS/M: 100 | 36 days supply | Qty: 15 | Fill #5

## 2018-02-28 MED FILL — JARDIANCE 25 MG TABLET: 25 | 30 days supply | Qty: 30 | Fill #2

## 2018-03-03 DIAGNOSIS — Z1231 Encounter for screening mammogram for malignant neoplasm of breast: Secondary | ICD-10-CM | POA: Diagnosis not present

## 2018-03-07 MED FILL — FLUCONAZOLE 100 MG TABLET: 100 | 3 days supply | Qty: 3 | Fill #1

## 2018-03-23 IMAGING — DX DG FOOT COMPLETE 3+V*L*
3 series · 3 of 3 positions shown · non-contrast
Comparison: None.

CLINICAL DATA: Initial encounter. 59 y/o female s/p injury to LEFT
foot last night, dropped a can of food on it. Intermittent pain at
distal 1st metatarsal/proximal phalanx of great toe.

EXAM:
LEFT FOOT - COMPLETE 3+ VIEW

[foot ap]
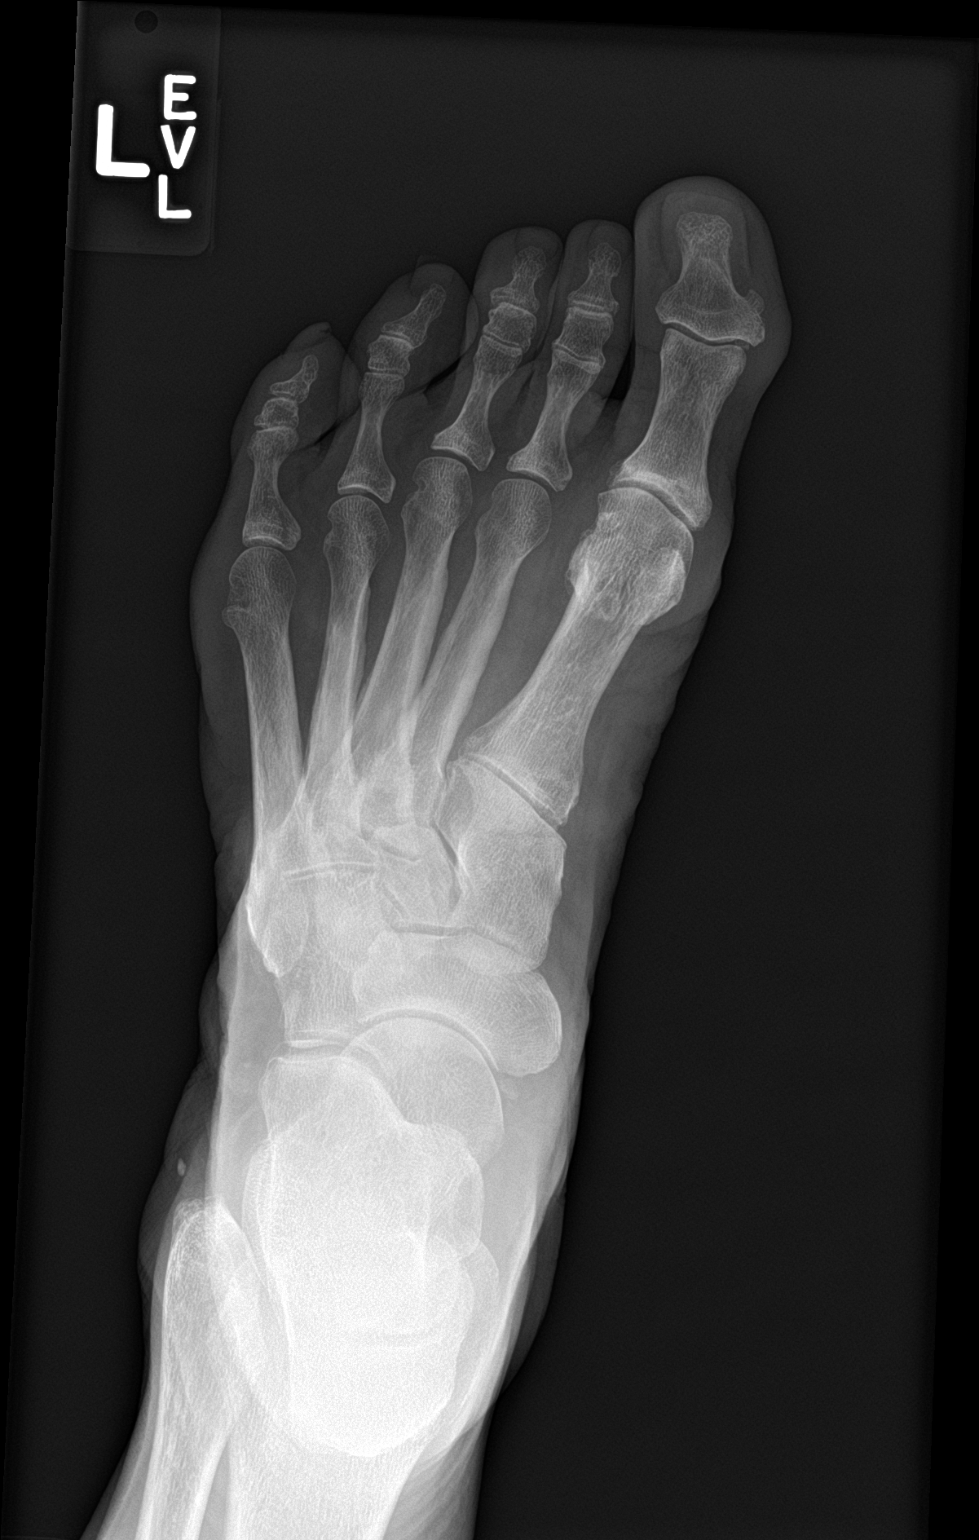

[foot obl]
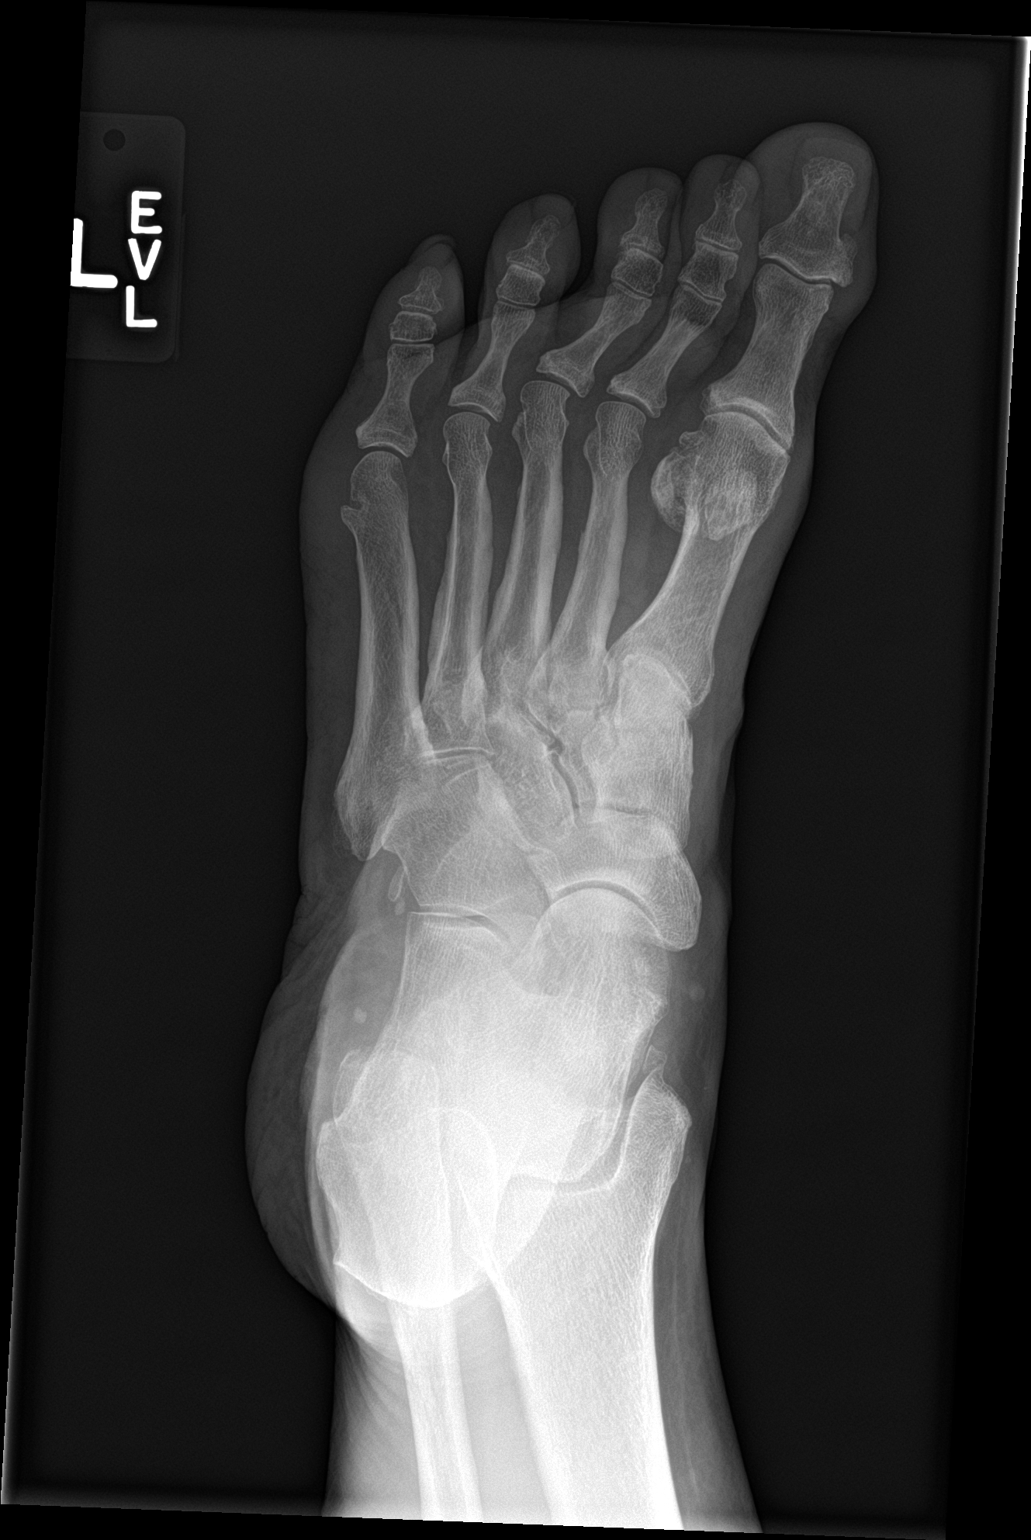

[foot lat]
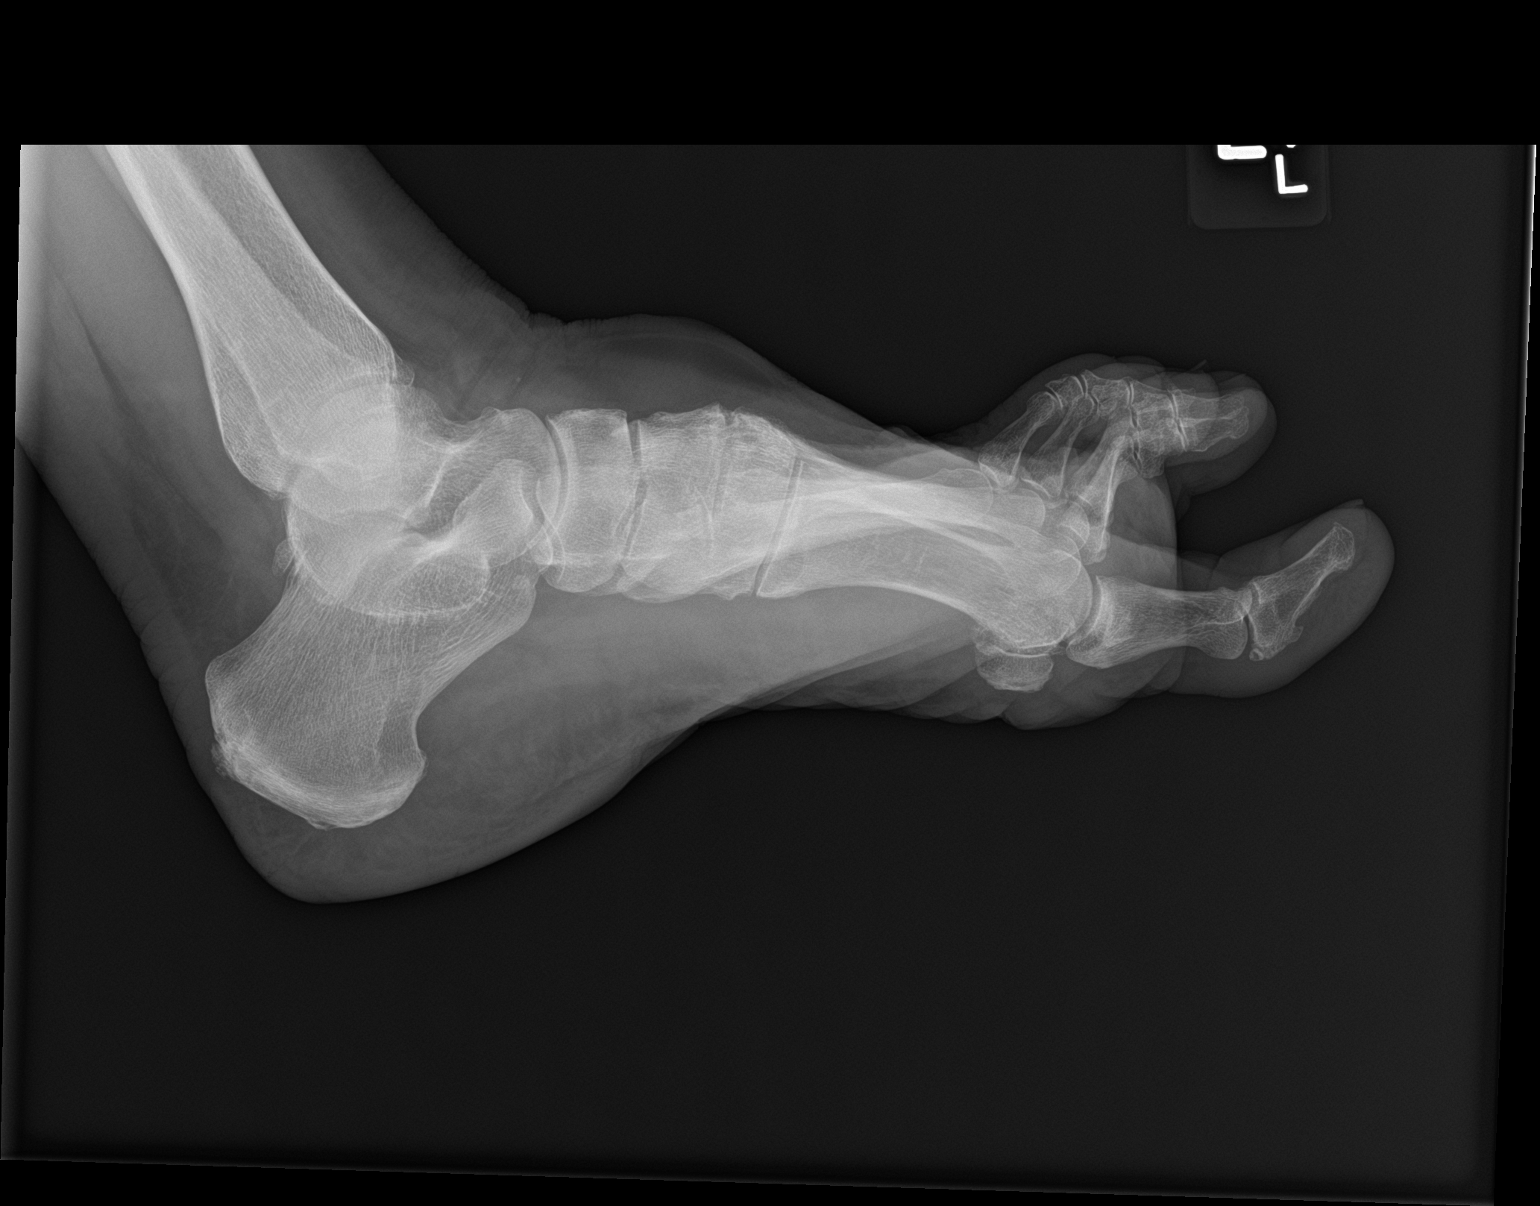

[3 of 3 positions shown; findings below may reference images not displayed]

FINDINGS: No fracture or dislocation of mid foot or forefoot. The phalanges
are normal. The calcaneus is normal. Soft tissue swelling over the
dorsum of foot.
IMPRESSION: 1. No evidence fracture.
2. Soft tissue swelling over the dorsum foot.

## 2018-03-31 MED FILL — CARVEDILOL 12.5 MG TABLET: 12.5 | 90 days supply | Qty: 90 | Fill #0

## 2018-04-02 MED FILL — LANTUS SOLOSTAR 100 UNITS/M: 100 | 36 days supply | Qty: 15 | Fill #0

## 2018-04-02 MED FILL — JARDIANCE 25 MG TABLET: 25 | 30 days supply | Qty: 30 | Fill #3

## 2018-04-02 MED FILL — METFORMIN HCL ER 500 MG TAB: 500 | 90 days supply | Qty: 90 | Fill #3

## 2018-04-02 MED FILL — RAMIPRIL 10 MG CAPSULE: 10 | 90 days supply | Qty: 90 | Fill #0

## 2018-05-16 DIAGNOSIS — J208 Acute bronchitis due to other specified organisms: Secondary | ICD-10-CM | POA: Diagnosis not present

## 2018-05-16 DIAGNOSIS — B9689 Other specified bacterial agents as the cause of diseases classified elsewhere: Secondary | ICD-10-CM | POA: Diagnosis not present

## 2018-05-21 MED FILL — levoFLOXacin 500 MG TABS: 500 | 10 days supply | Qty: 10 | Fill #0

## 2018-05-21 MED FILL — ULTICARE PEN NDL 8MM 31G: 31G X 8 MM | 90 days supply | Qty: 100 | Fill #0

## 2018-05-21 MED FILL — LANTUS SOLOSTAR 100 UNITS/M: 100 | 36 days supply | Qty: 15 | Fill #1

## 2018-06-02 DIAGNOSIS — E1165 Type 2 diabetes mellitus with hyperglycemia: Secondary | ICD-10-CM | POA: Diagnosis not present

## 2018-06-02 DIAGNOSIS — Z794 Long term (current) use of insulin: Secondary | ICD-10-CM | POA: Diagnosis not present

## 2018-06-02 MED FILL — JARDIANCE 25 MG TABLET: 25 | 30 days supply | Qty: 30 | Fill #4

## 2018-06-02 MED FILL — FLUCONAZOLE 100 MG TABLET: 100 | 3 days supply | Qty: 3 | Fill #2

## 2018-06-05 DIAGNOSIS — Z794 Long term (current) use of insulin: Secondary | ICD-10-CM | POA: Diagnosis not present

## 2018-06-05 DIAGNOSIS — E113292 Type 2 diabetes mellitus with mild nonproliferative diabetic retinopathy without macular edema, left eye: Secondary | ICD-10-CM | POA: Diagnosis not present

## 2018-06-05 DIAGNOSIS — H2513 Age-related nuclear cataract, bilateral: Secondary | ICD-10-CM | POA: Diagnosis not present

## 2018-06-05 MED FILL — ACCU-CHEK GUIDE TEST STRIP: 50 days supply | Qty: 100 | Fill #0

## 2018-06-06 MED FILL — FLUCONAZOLE 100 MG TABLET: 100 | 3 days supply | Qty: 3 | Fill #3

## 2018-06-18 MED FILL — TRULICITY 1.5 MG/0.5 ML PEN: 1.5 | 28 days supply | Qty: 2 | Fill #0

## 2018-06-26 MED FILL — METFORMIN HCL ER 500 MG TAB: 500 | 90 days supply | Qty: 90 | Fill #0

## 2018-06-26 MED FILL — CARVEDILOL 12.5 MG TABLET: 12.5 | 90 days supply | Qty: 90 | Fill #1

## 2018-06-26 MED FILL — RAMIPRIL 10 MG CAPSULE: 10 | 90 days supply | Qty: 90 | Fill #1

## 2018-06-26 MED FILL — JARDIANCE 25 MG TABLET: 25 | 30 days supply | Qty: 30 | Fill #5

## 2018-06-26 MED FILL — LANTUS SOLOSTAR 100 UNITS/M: 100 | 36 days supply | Qty: 15 | Fill #2

## 2018-07-17 MED FILL — TRULICITY 1.5 MG/0.5 ML PEN: 1.5 | 28 days supply | Qty: 2 | Fill #1

## 2018-08-06 MED FILL — LANTUS SOLOSTAR 100 UNITS/M: 100 | 36 days supply | Qty: 15 | Fill #3

## 2018-08-06 MED FILL — JARDIANCE 25 MG TABLET: 25 | 30 days supply | Qty: 30 | Fill #0

## 2018-08-08 MED FILL — TRULICITY 1.5 MG/0.5 ML PEN: 1.5 | 28 days supply | Qty: 2 | Fill #2

## 2018-09-02 DIAGNOSIS — Z23 Encounter for immunization: Secondary | ICD-10-CM | POA: Diagnosis not present

## 2018-09-02 DIAGNOSIS — Z6834 Body mass index (BMI) 34.0-34.9, adult: Secondary | ICD-10-CM | POA: Diagnosis not present

## 2018-09-02 DIAGNOSIS — E6609 Other obesity due to excess calories: Secondary | ICD-10-CM | POA: Diagnosis not present

## 2018-09-02 DIAGNOSIS — E1165 Type 2 diabetes mellitus with hyperglycemia: Secondary | ICD-10-CM | POA: Diagnosis not present

## 2018-09-02 DIAGNOSIS — E782 Mixed hyperlipidemia: Secondary | ICD-10-CM | POA: Diagnosis not present

## 2018-09-02 DIAGNOSIS — E559 Vitamin D deficiency, unspecified: Secondary | ICD-10-CM | POA: Diagnosis not present

## 2018-09-02 DIAGNOSIS — Z794 Long term (current) use of insulin: Secondary | ICD-10-CM | POA: Diagnosis not present

## 2018-09-02 DIAGNOSIS — I10 Essential (primary) hypertension: Secondary | ICD-10-CM | POA: Diagnosis not present

## 2018-09-02 DIAGNOSIS — Z0000 Encounter for general adult medical examination without abnormal findings: Secondary | ICD-10-CM | POA: Diagnosis not present

## 2018-09-05 DIAGNOSIS — M85852 Other specified disorders of bone density and structure, left thigh: Secondary | ICD-10-CM | POA: Diagnosis not present

## 2018-09-05 DIAGNOSIS — M779 Enthesopathy, unspecified: Secondary | ICD-10-CM | POA: Diagnosis not present

## 2018-09-05 DIAGNOSIS — M25561 Pain in right knee: Secondary | ICD-10-CM | POA: Diagnosis not present

## 2018-09-05 DIAGNOSIS — M8589 Other specified disorders of bone density and structure, multiple sites: Secondary | ICD-10-CM | POA: Diagnosis not present

## 2018-09-05 DIAGNOSIS — M1711 Unilateral primary osteoarthritis, right knee: Secondary | ICD-10-CM | POA: Diagnosis not present

## 2018-09-08 MED FILL — ALENDRONATE NA 70 MG TAB: 70 | 84 days supply | Qty: 12 | Fill #0

## 2018-09-11 MED FILL — TRULICITY 1.5 MG/0.5 ML PEN: 1.5 | 28 days supply | Qty: 2 | Fill #3

## 2018-09-11 MED FILL — ACCU-CHEK GUIDE TEST STRIP: 50 days supply | Qty: 100 | Fill #1

## 2018-09-11 MED FILL — LANTUS SOLOSTAR 100 UNITS/M: 100 | 36 days supply | Qty: 15 | Fill #4

## 2018-09-11 MED FILL — EZETIMIBE 10 MG TABLET: 10 | 30 days supply | Qty: 30 | Fill #1

## 2018-09-11 MED FILL — JARDIANCE 25 MG TABLET: 25 | 30 days supply | Qty: 30 | Fill #1

## 2018-09-11 MED FILL — ALPRAZolam 0.25 MG TABS: 0.25 | 30 days supply | Qty: 30 | Fill #0

## 2018-09-30 DIAGNOSIS — M65342 Trigger finger, left ring finger: Secondary | ICD-10-CM | POA: Diagnosis not present

## 2018-09-30 MED FILL — MOMETASONE FUROATE 0.1% CRE: 0.1 | 30 days supply | Qty: 45 | Fill #0

## 2018-10-02 MED FILL — metFORMIN HCL ER 500 MG TB2: 500 | 90 days supply | Qty: 90 | Fill #1

## 2018-10-02 MED FILL — CARVEDILOL 12.5 MG TABLET: 12.5 | 90 days supply | Qty: 90 | Fill #0

## 2018-10-02 MED FILL — RAMIPRIL 10 MG CAPSULE: 10 | 90 days supply | Qty: 90 | Fill #2

## 2018-10-03 MED FILL — TRULICITY 1.5 MG/0.5 ML PEN: 1.5 | 28 days supply | Qty: 2 | Fill #4

## 2018-10-28 MED FILL — TRULICITY 1.5 MG/0.5 ML PEN: 1.5 | 28 days supply | Qty: 2 | Fill #5

## 2018-10-28 MED FILL — ALPRAZolam 0.25 MG TABS: 0.25 | 30 days supply | Qty: 30 | Fill #1

## 2018-10-28 MED FILL — JARDIANCE 25 MG TABLET: 25 | 30 days supply | Qty: 30 | Fill #2

## 2018-10-28 MED FILL — ACCU-CHEK GUIDE TEST STRIP: 50 days supply | Qty: 100 | Fill #2

## 2018-10-28 MED FILL — LANTUS SOLOSTAR 100 UNITS/M: 100 | 36 days supply | Qty: 15 | Fill #5

## 2018-11-05 DIAGNOSIS — Z794 Long term (current) use of insulin: Secondary | ICD-10-CM | POA: Diagnosis not present

## 2018-11-05 DIAGNOSIS — E1165 Type 2 diabetes mellitus with hyperglycemia: Secondary | ICD-10-CM | POA: Diagnosis not present

## 2018-12-03 MED FILL — TRULICITY 1.5 MG/0.5 ML PEN: 1.5 | 28 days supply | Qty: 2 | Fill #0

## 2018-12-03 MED FILL — JARDIANCE 25 MG TABLET: 25 | 30 days supply | Qty: 30 | Fill #0

## 2018-12-04 MED FILL — ALENDRONATE NA 70 MG TAB: 70 | 84 days supply | Qty: 12 | Fill #1

## 2018-12-04 MED FILL — LANTUS SOLOSTAR 100 UNITS/M: 100 | 50 days supply | Qty: 15 | Fill #0

## 2018-12-08 DIAGNOSIS — H2513 Age-related nuclear cataract, bilateral: Secondary | ICD-10-CM | POA: Diagnosis not present

## 2018-12-08 DIAGNOSIS — Z7984 Long term (current) use of oral hypoglycemic drugs: Secondary | ICD-10-CM | POA: Diagnosis not present

## 2018-12-08 DIAGNOSIS — E113313 Type 2 diabetes mellitus with moderate nonproliferative diabetic retinopathy with macular edema, bilateral: Secondary | ICD-10-CM | POA: Diagnosis not present

## 2018-12-08 DIAGNOSIS — Z794 Long term (current) use of insulin: Secondary | ICD-10-CM | POA: Diagnosis not present

## 2018-12-08 DIAGNOSIS — H43393 Other vitreous opacities, bilateral: Secondary | ICD-10-CM | POA: Diagnosis not present

## 2018-12-19 MED FILL — RAMIPRIL 10 MG CAPSULE: 10 | 90 days supply | Qty: 180 | Fill #0

## 2018-12-19 MED FILL — ACCU-CHEK GUIDE TEST STRIP: 50 days supply | Qty: 100 | Fill #3

## 2018-12-24 DIAGNOSIS — D3132 Benign neoplasm of left choroid: Secondary | ICD-10-CM | POA: Diagnosis not present

## 2018-12-24 DIAGNOSIS — E113313 Type 2 diabetes mellitus with moderate nonproliferative diabetic retinopathy with macular edema, bilateral: Secondary | ICD-10-CM | POA: Diagnosis not present

## 2018-12-24 DIAGNOSIS — H2513 Age-related nuclear cataract, bilateral: Secondary | ICD-10-CM | POA: Diagnosis not present

## 2018-12-25 DIAGNOSIS — J029 Acute pharyngitis, unspecified: Secondary | ICD-10-CM | POA: Diagnosis not present

## 2018-12-25 DIAGNOSIS — I889 Nonspecific lymphadenitis, unspecified: Secondary | ICD-10-CM | POA: Diagnosis not present

## 2018-12-30 MED FILL — TRULICITY 1.5 MG/0.5 ML PEN: 1.5 | 28 days supply | Qty: 2 | Fill #1

## 2018-12-30 MED FILL — metFORMIN HCL ER 500 MG TB2: 500 | 90 days supply | Qty: 90 | Fill #2

## 2018-12-30 MED FILL — JARDIANCE 25 MG TABLET: 25 | 30 days supply | Qty: 30 | Fill #1

## 2018-12-30 MED FILL — CARVEDILOL 12.5 MG TABLET: 12.5 | 90 days supply | Qty: 90 | Fill #1

## 2019-01-07 MED FILL — FLUCONAZOLE 100 MG TAB: 100 | 3 days supply | Qty: 3 | Fill #4 | Status: TO

## 2019-01-15 DIAGNOSIS — E113313 Type 2 diabetes mellitus with moderate nonproliferative diabetic retinopathy with macular edema, bilateral: Secondary | ICD-10-CM | POA: Diagnosis not present

## 2019-01-15 DIAGNOSIS — E113312 Type 2 diabetes mellitus with moderate nonproliferative diabetic retinopathy with macular edema, left eye: Secondary | ICD-10-CM | POA: Diagnosis not present

## 2019-01-29 MED FILL — JARDIANCE 25 MG TABLET: 25 | 30 days supply | Qty: 30 | Fill #2

## 2019-01-29 MED FILL — LANTUS SOLOSTAR 100 UNITS/M: 100 | 50 days supply | Qty: 15 | Fill #1

## 2019-01-29 MED FILL — TRULICITY 1.5 MG/0.5 ML PEN: 1.5 | 28 days supply | Qty: 2 | Fill #2 | Status: TO

## 2019-02-16 MED FILL — TRULICITY 1.5 MG/0.5 ML PEN: 1.5 | 28 days supply | Qty: 2 | Fill #0 | Status: TO

## 2019-02-16 MED FILL — FLUCONAZOLE 100 MG TAB: 100 | 3 days supply | Qty: 3 | Fill #0

## 2019-02-20 DIAGNOSIS — I10 Essential (primary) hypertension: Secondary | ICD-10-CM | POA: Diagnosis not present

## 2019-02-24 DIAGNOSIS — I10 Essential (primary) hypertension: Secondary | ICD-10-CM | POA: Diagnosis not present

## 2019-02-24 MED FILL — CARVEDILOL 12.5 MG TABLET: 12.5 | 90 days supply | Qty: 180 | Fill #0

## 2019-02-27 MED FILL — JARDIANCE 25 MG TABLET: 25 | 30 days supply | Qty: 30 | Fill #3

## 2019-03-11 DIAGNOSIS — Z794 Long term (current) use of insulin: Secondary | ICD-10-CM | POA: Diagnosis not present

## 2019-03-11 DIAGNOSIS — E559 Vitamin D deficiency, unspecified: Secondary | ICD-10-CM | POA: Diagnosis not present

## 2019-03-11 DIAGNOSIS — I10 Essential (primary) hypertension: Secondary | ICD-10-CM | POA: Diagnosis not present

## 2019-03-11 DIAGNOSIS — R82998 Other abnormal findings in urine: Secondary | ICD-10-CM | POA: Diagnosis not present

## 2019-03-11 DIAGNOSIS — Z6833 Body mass index (BMI) 33.0-33.9, adult: Secondary | ICD-10-CM | POA: Diagnosis not present

## 2019-03-11 DIAGNOSIS — E119 Type 2 diabetes mellitus without complications: Secondary | ICD-10-CM | POA: Diagnosis not present

## 2019-03-11 DIAGNOSIS — E782 Mixed hyperlipidemia: Secondary | ICD-10-CM | POA: Diagnosis not present

## 2019-03-11 DIAGNOSIS — Z1231 Encounter for screening mammogram for malignant neoplasm of breast: Secondary | ICD-10-CM | POA: Diagnosis not present

## 2019-03-11 DIAGNOSIS — E669 Obesity, unspecified: Secondary | ICD-10-CM | POA: Diagnosis not present

## 2019-03-17 MED FILL — ACCU-CHEK GUIDE TEST STRIP: 50 days supply | Qty: 100 | Fill #4

## 2019-03-17 MED FILL — SM ALCOHOL 70% PREP PADS: 70 | 30 days supply | Qty: 100 | Fill #0

## 2019-03-17 MED FILL — RAMIPRIL 10 MG CAPSULE: 10 | 90 days supply | Qty: 180 | Fill #1

## 2019-03-17 MED FILL — LANTUS SOLOSTAR 100 UNITS/M: 100 | 50 days supply | Qty: 15 | Fill #2

## 2019-03-17 MED FILL — TRULICITY 1.5 MG/0.5 ML PEN: 1.5 | 28 days supply | Qty: 2 | Fill #0

## 2019-04-09 MED FILL — JARDIANCE 25 MG TABLET: 25 | 30 days supply | Qty: 30 | Fill #4

## 2019-04-09 MED FILL — TRULICITY 1.5 MG/0.5 ML PEN: 1.5 | 28 days supply | Qty: 2 | Fill #1

## 2019-04-10 MED FILL — metFORMIN HCL ER 500 MG TB2: 500 | 90 days supply | Qty: 90 | Fill #3

## 2019-05-07 MED FILL — ALPRAZolam 0.25 MG TABS: 0.25 | 30 days supply | Qty: 30 | Fill #0

## 2019-05-07 MED FILL — LANTUS SOLOSTAR 100 UNITS/M: 100 | 50 days supply | Qty: 15 | Fill #3

## 2019-05-13 MED FILL — TRULICITY 1.5 MG/0.5 ML PEN: 1.5 | 28 days supply | Qty: 2 | Fill #0

## 2019-05-18 DIAGNOSIS — Z1231 Encounter for screening mammogram for malignant neoplasm of breast: Secondary | ICD-10-CM | POA: Diagnosis not present

## 2019-05-19 MED FILL — JARDIANCE 25 MG TABLET: 25 | 30 days supply | Qty: 30 | Fill #5

## 2019-06-11 DIAGNOSIS — R200 Anesthesia of skin: Secondary | ICD-10-CM | POA: Diagnosis not present

## 2019-06-11 DIAGNOSIS — E113313 Type 2 diabetes mellitus with moderate nonproliferative diabetic retinopathy with macular edema, bilateral: Secondary | ICD-10-CM | POA: Diagnosis not present

## 2019-06-11 DIAGNOSIS — R202 Paresthesia of skin: Secondary | ICD-10-CM | POA: Diagnosis not present

## 2019-06-11 DIAGNOSIS — M7061 Trochanteric bursitis, right hip: Secondary | ICD-10-CM | POA: Diagnosis not present

## 2019-06-17 MED FILL — JARDIANCE 25 MG TABLET: 25 | 30 days supply | Qty: 30 | Fill #0

## 2019-06-17 MED FILL — TRULICITY 1.5 MG/0.5 ML PEN: 1.5 | 28 days supply | Qty: 2 | Fill #1

## 2019-06-17 MED FILL — RAMIPRIL 10 MG CAPSULE: 10 | 90 days supply | Qty: 180 | Fill #2

## 2019-06-29 MED FILL — LANTUS SOLOSTAR 100 UNITS/M: 100 | 50 days supply | Qty: 15 | Fill #4

## 2019-06-30 MED FILL — METFORMIN HCL ER 500 MG TB2: 500 | 90 days supply | Qty: 90 | Fill #0

## 2019-07-17 MED FILL — JARDIANCE 25 MG TABLET: 25 | 30 days supply | Qty: 30 | Fill #1

## 2019-07-17 MED FILL — TRULICITY 1.5 MG/0.5 ML PEN: 1.5 | 28 days supply | Qty: 2 | Fill #2

## 2019-07-17 MED FILL — ALPRAZolam 0.25 MG TABS: 0.25 | 30 days supply | Qty: 30 | Fill #1

## 2019-07-20 DIAGNOSIS — H547 Unspecified visual loss: Secondary | ICD-10-CM | POA: Diagnosis not present

## 2019-07-20 DIAGNOSIS — Z794 Long term (current) use of insulin: Secondary | ICD-10-CM | POA: Diagnosis not present

## 2019-07-20 DIAGNOSIS — E118 Type 2 diabetes mellitus with unspecified complications: Secondary | ICD-10-CM | POA: Diagnosis not present

## 2019-07-20 DIAGNOSIS — E113399 Type 2 diabetes mellitus with moderate nonproliferative diabetic retinopathy without macular edema, unspecified eye: Secondary | ICD-10-CM | POA: Diagnosis not present

## 2019-07-29 DIAGNOSIS — G5711 Meralgia paresthetica, right lower limb: Secondary | ICD-10-CM | POA: Diagnosis not present

## 2019-07-29 MED FILL — GABAPENTIN 100 MG CAPSULE: 100 | 30 days supply | Qty: 90 | Fill #0

## 2019-07-31 MED FILL — CARVEDILOL 12.5 MG TABLET: 12.5 | 90 days supply | Qty: 180 | Fill #1

## 2019-08-12 DIAGNOSIS — G5711 Meralgia paresthetica, right lower limb: Secondary | ICD-10-CM | POA: Diagnosis not present

## 2019-08-13 MED FILL — JARDIANCE 25 MG TABLET: 25 | 30 days supply | Qty: 30 | Fill #2

## 2019-08-13 MED FILL — LANTUS SOLOSTAR 100 UNITS/M: 100 | 50 days supply | Qty: 15 | Fill #5

## 2019-08-13 MED FILL — TRULICITY 1.5 MG/0.5 ML PEN: 1.5 | 28 days supply | Qty: 2 | Fill #3

## 2019-08-28 DIAGNOSIS — M25572 Pain in left ankle and joints of left foot: Secondary | ICD-10-CM | POA: Diagnosis not present

## 2019-09-07 MED FILL — JARDIANCE 25 MG TABLET: 25 | 30 days supply | Qty: 30 | Fill #3

## 2019-09-07 MED FILL — TRULICITY 1.5 MG/0.5 ML PEN: 1.5 | 28 days supply | Qty: 2 | Fill #4

## 2019-09-07 MED FILL — GABAPENTIN 100 MG CAPSULE: 100 | 30 days supply | Qty: 90 | Fill #1

## 2019-09-08 DIAGNOSIS — M25572 Pain in left ankle and joints of left foot: Secondary | ICD-10-CM | POA: Diagnosis not present

## 2019-09-10 DIAGNOSIS — E6609 Other obesity due to excess calories: Secondary | ICD-10-CM | POA: Diagnosis not present

## 2019-09-10 DIAGNOSIS — E559 Vitamin D deficiency, unspecified: Secondary | ICD-10-CM | POA: Diagnosis not present

## 2019-09-10 DIAGNOSIS — M8580 Other specified disorders of bone density and structure, unspecified site: Secondary | ICD-10-CM | POA: Diagnosis not present

## 2019-09-10 DIAGNOSIS — R82998 Other abnormal findings in urine: Secondary | ICD-10-CM | POA: Diagnosis not present

## 2019-09-10 DIAGNOSIS — Z794 Long term (current) use of insulin: Secondary | ICD-10-CM | POA: Diagnosis not present

## 2019-09-10 DIAGNOSIS — I10 Essential (primary) hypertension: Secondary | ICD-10-CM | POA: Diagnosis not present

## 2019-09-10 DIAGNOSIS — E118 Type 2 diabetes mellitus with unspecified complications: Secondary | ICD-10-CM | POA: Diagnosis not present

## 2019-09-10 DIAGNOSIS — Z23 Encounter for immunization: Secondary | ICD-10-CM | POA: Diagnosis not present

## 2019-09-10 DIAGNOSIS — E782 Mixed hyperlipidemia: Secondary | ICD-10-CM | POA: Diagnosis not present

## 2019-09-10 DIAGNOSIS — F411 Generalized anxiety disorder: Secondary | ICD-10-CM | POA: Diagnosis not present

## 2019-09-10 MED FILL — FLUoxetine HCL 10 MG CAPS: 10 | 30 days supply | Qty: 30 | Fill #0

## 2019-09-21 DIAGNOSIS — R350 Frequency of micturition: Secondary | ICD-10-CM | POA: Diagnosis not present

## 2019-09-21 DIAGNOSIS — N3000 Acute cystitis without hematuria: Secondary | ICD-10-CM | POA: Diagnosis not present

## 2019-09-21 DIAGNOSIS — R82998 Other abnormal findings in urine: Secondary | ICD-10-CM | POA: Diagnosis not present

## 2019-09-22 MED FILL — RAMIPRIL 10 MG CAPSULE: 10 | 90 days supply | Qty: 180 | Fill #3

## 2019-09-22 MED FILL — ALPRAZolam 0.25 MG TABS: 0.25 | 30 days supply | Qty: 30 | Fill #0

## 2019-10-02 MED FILL — LANTUS SOLOSTAR 100 UNITS/M: 100 | 50 days supply | Qty: 15 | Fill #6

## 2019-10-02 MED FILL — metFORMIN HCL ER 500 MG TB2: 500 | 90 days supply | Qty: 90 | Fill #1

## 2019-10-02 MED FILL — TRULICITY 1.5 MG/0.5 ML PEN: 1.5 | 28 days supply | Qty: 2 | Fill #5

## 2019-10-09 MED FILL — GABAPENTIN 100 MG CAPSULE: 100 | 30 days supply | Qty: 90 | Fill #2

## 2019-10-09 MED FILL — FLUoxetine HCL 10 MG CAPS: 10 | 30 days supply | Qty: 30 | Fill #1

## 2019-10-23 ENCOUNTER — Other Ambulatory Visit: Payer: Self-pay

## 2019-10-23 ENCOUNTER — Emergency Department
Admission: EM | Admit: 2019-10-23 | Discharge: 2019-10-23 | Disposition: A | Discharge status: Home / Self Care | Payer: 59 | Source: Home / Self Care

## 2019-10-23 ENCOUNTER — Encounter: Payer: Self-pay | Admitting: Family Medicine

## 2019-10-23 DIAGNOSIS — S91112A Laceration without foreign body of left great toe without damage to nail, initial encounter: Secondary | ICD-10-CM

## 2019-10-23 DIAGNOSIS — S91112S Laceration without foreign body of left great toe without damage to nail, sequela: Secondary | ICD-10-CM

## 2019-10-23 MED ORDER — DOXYCYCLINE HYCLATE 100 MG PO TABS: 100.0000 mg | ORAL_TABLET | Freq: Two times a day (BID) | ORAL | 0 refills | Status: AC

## 2019-10-23 MED ORDER — FLUCONAZOLE 150 MG PO TABS: 150.0000 mg | ORAL_TABLET | Freq: Once | ORAL | 0 refills | Status: AC

## 2019-10-23 MED FILL — CARVEDILOL 12.5 MG TABLET: 12.5 | 90 days supply | Qty: 180 | Fill #0

## 2019-10-23 MED FILL — FLUCONAZOLE 150 MG TABS: 150 | 2 days supply | Qty: 2 | Fill #0

## 2019-10-23 MED FILL — DOXYCYCLINE HYCLATE 100 MG: 100 | 10 days supply | Qty: 20 | Fill #0

## 2019-10-23 MED FILL — JARDIANCE 25 MG TABLET: 25 | 30 days supply | Qty: 30 | Fill #4

## 2019-10-23 NOTE — ED Triage Notes (Signed)
Pt had splinter in left foot on Wednesday.  Pt thinks that she took the splinter out.  It has become sore and red since.  Has some yellow drainage.

## 2019-10-23 NOTE — ED Provider Notes (Addendum)
[name]    CSN: [id number] Arrival date & time: 10/23/19  1237      History   Chief Complaint Chief Complaint  Patient presents with  . Foot Injury    HPI Melanie Sweeney is a 63 y.o. female.   Initial visit for this 63 yo woman with foot infection  Pt had splinter in left foot on Wednesday.  Pt thinks that she took the splinter out.  It has become sore and red since.  Has some yellow drainage.  Patient works for [employer] and is sure she's had a tetanus shot in last 10 years     Past Medical History:  Diagnosis Date  . Diabetes mellitus without complication ([place])   . Hyperlipidemia   . Hypertension     There are no active problems to display for this patient.   Past Surgical History:  Procedure Laterality Date  . ABDOMINAL HYSTERECTOMY    . LAPAROSCOPIC GASTRIC BANDING    . ROTATOR CUFF REPAIR      OB History   No obstetric history on file.      Home Medications    Prior to Admission medications   Medication Sig Start Date End Date Taking? Authorizing Provider  empagliflozin (JARDIANCE) 25 MG TABS tablet TAKE ONE TABLET (25 MG TOTAL) BY MOUTH DAILY. 06/17/19  Yes [provider]  FLUoxetine (PROZAC) 10 MG capsule Take by mouth. 09/10/19  Yes [provider]  gabapentin (NEURONTIN) 100 MG capsule Take 1 caps at bedtime for 1wk, then inc to 2 caps at bedtime x1wk, then inc to 3 caps at bedtime. 07/29/19  Yes [provider]  canagliflozin (INVOKANA) 300 MG TABS tablet Take 300 mg by mouth daily before breakfast.    [provider]  carvedilol (COREG) 12.5 MG tablet Take 12.5 mg by mouth 2 (two) times daily with a meal.    [provider]  doxycycline (VIBRA-TABS) 100 MG tablet Take 1 tablet (100 mg total) by mouth 2 (two) times daily. 10/23/19   Robyn Haber, MD  fluconazole (DIFLUCAN) 150 MG tablet Take 1 tablet (150 mg total) by mouth once for 1 dose. Repeat if needed 10/23/19 10/23/19   Robyn Haber, MD  insulin glargine (LANTUS) 100 UNIT/ML injection Inject into the skin at bedtime.    [provider]  metFORMIN (GLUCOPHAGE-XR) 500 MG 24 hr tablet Take once a day with supper. 08/03/16   [provider]  ramipril (ALTACE) 10 MG capsule Take 10 mg by mouth daily.    [provider]    Family History Family History  Problem Relation Age of Onset  . Hypertension Mother   . Diabetes Mother   . Stroke Mother   . Diabetes Father   . Hypertension Father   . Cancer Father        Lung Ca  . Lymphoma Father     Social History Social History   Tobacco Use  . Smoking status: Never Smoker  . Smokeless tobacco: Never Used  Substance Use Topics  . Alcohol use: No  . Drug use: No     Allergies   Codeine, Iodine, Statins, and Shrimp [shellfish allergy]   Review of Systems Review of Systems   Physical Exam Triage Vital Signs ED Triage Vitals  Enc Vitals Group     BP 10/23/19 1353 124/62     Pulse Rate 10/23/19 1353 69     Resp 10/23/19 1353 20     Temp 10/23/19 1353 97.9 F (  36.6 C)     Temp Source 10/23/19 1353 Oral     SpO2 10/23/19 1353 98 %     Weight 10/23/19 1354 179 lb (81.2 kg)     Height 10/23/19 1354 5\' 2"  (1.575 m)     Head Circumference --      Peak Flow --      Pain Score 10/23/19 1354 3     Pain Loc --      Pain Edu? --      Excl. in [place]? --    No data found.  Updated Vital Signs BP 124/62 (BP Location: Right Arm)   Pulse 69   Temp 97.9 F (36.6 C) (Oral)   Resp 20   Ht 5\' 2"  (1.575 m)   Wt 81.2 kg   SpO2 98%   BMI 32.74 kg/m    Physical Exam Vitals signs and nursing note reviewed.  Constitutional:      Appearance: Normal appearance.  Eyes:     Conjunctiva/sclera: Conjunctivae normal.  Neck:     Musculoskeletal: Normal range of motion and neck supple.  Pulmonary:     Effort: Pulmonary effort is normal.  Skin:    General: Skin is warm.     Findings: Erythema present.     Comments: Lateral  aspect of left MTP joint of 1st metatarsal shows 2 cm area of tenderness and erythema with clear serous drainage.  Area was debrided of small amount of F.B.  No fluctuance felt and no purulence seen.  Neurological:     General: No focal deficit present.     Mental Status: She is alert.  Psychiatric:        Mood and Affect: Mood normal.      UC Treatments / Results  Labs (all labs ordered are listed, but only abnormal results are displayed) Labs Reviewed - No data to display  EKG   Radiology No results found.  Procedures Procedures (including critical care time)  Medications Ordered in UC Medications - No data to display  Initial Impression / Assessment and Plan / UC Course  I have reviewed the triage vital signs and the nursing notes.  Pertinent labs & imaging results that were available during my care of the patient were reviewed by me and considered in my medical decision making (see chart for details).    Final Clinical Impressions(s) / UC Diagnoses   Final diagnoses:  Laceration of great toe of left foot, foreign body presence unspecified, nail damage status unspecified, sequela     Discharge Instructions     Gently wash the affected area with soap or Epsom Salts several times a day.  If no improvement in 48 hours, return for recheck.    ED Prescriptions    Medication Sig Dispense Auth. Provider   doxycycline (VIBRA-TABS) 100 MG tablet Take 1 tablet (100 mg total) by mouth 2 (two) times daily. 20 tablet Robyn Haber, MD   fluconazole (DIFLUCAN) 150 MG tablet Take 1 tablet (150 mg total) by mouth once for 1 dose. Repeat if needed 2 tablet Robyn Haber, MD     I have reviewed the PDMP during this encounter.   Robyn Haber, MD 10/23/19 1416    Robyn Haber, MD 10/23/19 1419

## 2019-10-23 NOTE — Discharge Instructions (Addendum)
Gently wash the affected area with soap or Epsom Salts several times a day.  If no improvement in 48 hours, return for recheck.

## 2019-10-26 MED FILL — ACCU-CHEK GUIDE TEST STRIP: 50 days supply | Qty: 100 | Fill #0

## 2019-11-02 MED FILL — ALPRAZolam 0.25 MG TABS: 0.25 | 30 days supply | Qty: 30 | Fill #1

## 2019-11-03 MED FILL — TRULICITY 1.5 MG/0.5 ML PEN: 1.5 | 28 days supply | Qty: 2 | Fill #0

## 2019-11-04 MED FILL — GABAPENTIN 100 MG CAPSULE: 100 | 30 days supply | Qty: 90 | Fill #3

## 2019-11-04 MED FILL — FLUoxetine HCL 10 MG CAPS: 10 | 30 days supply | Qty: 30 | Fill #2

## 2019-11-12 DIAGNOSIS — F411 Generalized anxiety disorder: Secondary | ICD-10-CM | POA: Diagnosis not present

## 2019-11-12 DIAGNOSIS — Z23 Encounter for immunization: Secondary | ICD-10-CM | POA: Diagnosis not present

## 2019-11-12 DIAGNOSIS — Z0000 Encounter for general adult medical examination without abnormal findings: Secondary | ICD-10-CM | POA: Diagnosis not present

## 2019-11-12 MED FILL — FLUoxetine HCL 20 MG CAPS: 20 | 30 days supply | Qty: 30 | Fill #0

## 2019-11-16 MED FILL — LANTUS SOLOSTAR 100 UNITS/M: 100 | 50 days supply | Qty: 15 | Fill #0

## 2019-12-03 MED FILL — GABAPENTIN 100 MG CAPSULE: 100 | 30 days supply | Qty: 90 | Fill #4

## 2019-12-03 MED FILL — JARDIANCE 25 MG TABLET: 25 | 30 days supply | Qty: 30 | Fill #5

## 2019-12-03 MED FILL — TRULICITY 1.5 MG/0.5 ML PEN: 1.5 | 28 days supply | Qty: 2 | Fill #1

## 2019-12-22 MED FILL — FLUoxetine HCL 20 MG CAPS: 20 | 30 days supply | Qty: 30 | Fill #1

## 2019-12-22 MED FILL — RAMIPRIL 10 MG CAPSULE: 10 | 90 days supply | Qty: 180 | Fill #0

## 2019-12-22 MED FILL — metFORMIN HCL ER 500 MG TB2: 500 | 90 days supply | Qty: 90 | Fill #2

## 2019-12-24 MED FILL — FREESTYLE LITE TEST STRIP: 50 days supply | Qty: 50 | Fill #0

## 2019-12-24 MED FILL — FREESTYLE LANCETS: 90 days supply | Qty: 100 | Fill #0

## 2019-12-24 MED FILL — FREESTYLE LITE METER: 30 days supply | Qty: 1 | Fill #0

## 2020-01-01 MED FILL — LANTUS SOLOSTAR 100 UNITS/M: 100 | 50 days supply | Qty: 15 | Fill #1

## 2020-01-01 MED FILL — GABAPENTIN 100 MG CAPSULE: 100 | 30 days supply | Qty: 90 | Fill #5

## 2020-01-01 MED FILL — TRULICITY 1.5 MG/0.5 ML PEN: 1.5 | 28 days supply | Qty: 2 | Fill #2

## 2020-01-06 MED FILL — JARDIANCE 25 MG TABLET: 25 | 30 days supply | Qty: 30 | Fill #0

## 2020-01-18 DIAGNOSIS — E782 Mixed hyperlipidemia: Secondary | ICD-10-CM | POA: Diagnosis not present

## 2020-01-18 DIAGNOSIS — R0602 Shortness of breath: Secondary | ICD-10-CM | POA: Diagnosis not present

## 2020-01-18 DIAGNOSIS — I10 Essential (primary) hypertension: Secondary | ICD-10-CM | POA: Diagnosis not present

## 2020-01-19 MED FILL — CARVEDILOL 12.5 MG TABLET: 12.5 | 90 days supply | Qty: 180 | Fill #1

## 2020-01-19 MED FILL — FLUoxetine HCL 20 MG CAPS: 20 | 30 days supply | Qty: 30 | Fill #2

## 2020-01-20 DIAGNOSIS — E782 Mixed hyperlipidemia: Secondary | ICD-10-CM | POA: Diagnosis not present

## 2020-01-20 DIAGNOSIS — E118 Type 2 diabetes mellitus with unspecified complications: Secondary | ICD-10-CM | POA: Diagnosis not present

## 2020-01-20 DIAGNOSIS — Z794 Long term (current) use of insulin: Secondary | ICD-10-CM | POA: Diagnosis not present

## 2020-01-20 MED FILL — ALPRAZolam 0.25 MG TABS: 0.25 | 30 days supply | Qty: 30 | Fill #0

## 2020-01-27 MED FILL — TRULICITY 1.5 MG/0.5 ML PEN: 1.5 | 28 days supply | Qty: 2 | Fill #3

## 2020-01-28 MED FILL — FLUCONAZOLE 100 MG TAB: 100 | 3 days supply | Qty: 3 | Fill #0

## 2020-01-29 MED FILL — PRAVASTATIN NA 20 MG TAB: 20 | 90 days supply | Qty: 90 | Fill #0

## 2020-02-03 DIAGNOSIS — G5711 Meralgia paresthetica, right lower limb: Secondary | ICD-10-CM | POA: Diagnosis not present

## 2020-02-03 MED FILL — GABAPENTIN 100 MG CAPSULE: 100 | 30 days supply | Qty: 90 | Fill #0

## 2020-02-15 MED FILL — GABAPENTIN 100 MG CAPSULE: 100 | 30 days supply | Qty: 90 | Fill #0

## 2020-02-15 MED FILL — FLUoxetine HCL 20 MG CAPS: 20 | 30 days supply | Qty: 30 | Fill #3

## 2020-02-15 MED FILL — LANTUS SOLOSTAR 100 UNITS/M: 100 | 50 days supply | Qty: 15 | Fill #2

## 2020-02-15 MED FILL — ALPRAZolam 0.25 MG TABS: 0.25 | 30 days supply | Qty: 30 | Fill #1

## 2020-02-15 MED FILL — JARDIANCE 25 MG TABLET: 25 | 30 days supply | Qty: 30 | Fill #1

## 2020-02-16 DIAGNOSIS — R918 Other nonspecific abnormal finding of lung field: Secondary | ICD-10-CM | POA: Diagnosis not present

## 2020-02-23 MED FILL — TRULICITY 1.5 MG/0.5 ML PEN: 1.5 | 28 days supply | Qty: 2 | Fill #4

## 2020-02-29 DIAGNOSIS — E782 Mixed hyperlipidemia: Secondary | ICD-10-CM | POA: Diagnosis not present

## 2020-02-29 DIAGNOSIS — I10 Essential (primary) hypertension: Secondary | ICD-10-CM | POA: Diagnosis not present

## 2020-02-29 DIAGNOSIS — R0602 Shortness of breath: Secondary | ICD-10-CM | POA: Diagnosis not present

## 2020-02-29 DIAGNOSIS — R931 Abnormal findings on diagnostic imaging of heart and coronary circulation: Secondary | ICD-10-CM | POA: Diagnosis not present

## 2020-03-02 MED FILL — AMLODIPINE 2.5 MG TABLET: 2.5 | 30 days supply | Qty: 30 | Fill #0

## 2020-03-04 DIAGNOSIS — R918 Other nonspecific abnormal finding of lung field: Secondary | ICD-10-CM | POA: Diagnosis not present

## 2020-03-04 DIAGNOSIS — R911 Solitary pulmonary nodule: Secondary | ICD-10-CM | POA: Diagnosis not present

## 2020-03-04 DIAGNOSIS — K228 Other specified diseases of esophagus: Secondary | ICD-10-CM | POA: Diagnosis not present

## 2020-03-07 DIAGNOSIS — R911 Solitary pulmonary nodule: Secondary | ICD-10-CM | POA: Diagnosis not present

## 2020-03-16 DIAGNOSIS — I10 Essential (primary) hypertension: Secondary | ICD-10-CM | POA: Diagnosis not present

## 2020-03-16 DIAGNOSIS — I517 Cardiomegaly: Secondary | ICD-10-CM | POA: Diagnosis not present

## 2020-03-16 DIAGNOSIS — Z79899 Other long term (current) drug therapy: Secondary | ICD-10-CM | POA: Diagnosis not present

## 2020-03-16 DIAGNOSIS — R911 Solitary pulmonary nodule: Secondary | ICD-10-CM | POA: Diagnosis not present

## 2020-03-16 DIAGNOSIS — R9439 Abnormal result of other cardiovascular function study: Secondary | ICD-10-CM | POA: Diagnosis not present

## 2020-03-16 DIAGNOSIS — Z20822 Contact with and (suspected) exposure to covid-19: Secondary | ICD-10-CM | POA: Diagnosis not present

## 2020-03-16 DIAGNOSIS — E669 Obesity, unspecified: Secondary | ICD-10-CM | POA: Diagnosis not present

## 2020-03-16 DIAGNOSIS — R0600 Dyspnea, unspecified: Secondary | ICD-10-CM | POA: Diagnosis not present

## 2020-03-16 DIAGNOSIS — R079 Chest pain, unspecified: Secondary | ICD-10-CM | POA: Diagnosis not present

## 2020-03-16 DIAGNOSIS — Z888 Allergy status to other drugs, medicaments and biological substances status: Secondary | ICD-10-CM | POA: Diagnosis not present

## 2020-03-16 DIAGNOSIS — E7801 Familial hypercholesterolemia: Secondary | ICD-10-CM | POA: Diagnosis not present

## 2020-03-16 DIAGNOSIS — Z789 Other specified health status: Secondary | ICD-10-CM | POA: Diagnosis not present

## 2020-03-16 DIAGNOSIS — E119 Type 2 diabetes mellitus without complications: Secondary | ICD-10-CM | POA: Diagnosis not present

## 2020-03-16 DIAGNOSIS — I200 Unstable angina: Secondary | ICD-10-CM | POA: Diagnosis not present

## 2020-03-16 DIAGNOSIS — R778 Other specified abnormalities of plasma proteins: Secondary | ICD-10-CM | POA: Diagnosis not present

## 2020-03-16 DIAGNOSIS — I088 Other rheumatic multiple valve diseases: Secondary | ICD-10-CM | POA: Diagnosis not present

## 2020-03-16 DIAGNOSIS — Z794 Long term (current) use of insulin: Secondary | ICD-10-CM | POA: Diagnosis not present

## 2020-03-16 DIAGNOSIS — F411 Generalized anxiety disorder: Secondary | ICD-10-CM | POA: Diagnosis not present

## 2020-03-16 DIAGNOSIS — J90 Pleural effusion, not elsewhere classified: Secondary | ICD-10-CM | POA: Diagnosis not present

## 2020-03-16 DIAGNOSIS — I6522 Occlusion and stenosis of left carotid artery: Secondary | ICD-10-CM | POA: Diagnosis not present

## 2020-03-16 DIAGNOSIS — E1165 Type 2 diabetes mellitus with hyperglycemia: Secondary | ICD-10-CM | POA: Diagnosis not present

## 2020-03-16 DIAGNOSIS — Z6833 Body mass index (BMI) 33.0-33.9, adult: Secondary | ICD-10-CM | POA: Diagnosis not present

## 2020-03-16 DIAGNOSIS — Z7983 Long term (current) use of bisphosphonates: Secondary | ICD-10-CM | POA: Diagnosis not present

## 2020-03-16 DIAGNOSIS — R918 Other nonspecific abnormal finding of lung field: Secondary | ICD-10-CM | POA: Diagnosis not present

## 2020-03-16 DIAGNOSIS — E6609 Other obesity due to excess calories: Secondary | ICD-10-CM | POA: Diagnosis not present

## 2020-03-16 DIAGNOSIS — Z01818 Encounter for other preprocedural examination: Secondary | ICD-10-CM | POA: Diagnosis not present

## 2020-03-16 DIAGNOSIS — I2510 Atherosclerotic heart disease of native coronary artery without angina pectoris: Secondary | ICD-10-CM | POA: Diagnosis not present

## 2020-03-16 DIAGNOSIS — I25110 Atherosclerotic heart disease of native coronary artery with unstable angina pectoris: Secondary | ICD-10-CM | POA: Diagnosis not present

## 2020-03-16 DIAGNOSIS — R0789 Other chest pain: Secondary | ICD-10-CM | POA: Diagnosis not present

## 2020-03-16 DIAGNOSIS — E785 Hyperlipidemia, unspecified: Secondary | ICD-10-CM | POA: Diagnosis not present

## 2020-03-16 DIAGNOSIS — I080 Rheumatic disorders of both mitral and aortic valves: Secondary | ICD-10-CM | POA: Diagnosis not present

## 2020-03-16 DIAGNOSIS — Z951 Presence of aortocoronary bypass graft: Secondary | ICD-10-CM | POA: Diagnosis not present

## 2020-03-16 DIAGNOSIS — E118 Type 2 diabetes mellitus with unspecified complications: Secondary | ICD-10-CM | POA: Diagnosis not present

## 2020-03-16 DIAGNOSIS — E782 Mixed hyperlipidemia: Secondary | ICD-10-CM | POA: Diagnosis not present

## 2020-03-21 MED ORDER — LISINOPRIL 10 MG PO TABS
5.00 | ORAL_TABLET | ORAL | Status: DC
Start: 2020-03-22 — End: 2020-03-21

## 2020-03-21 MED ORDER — ALUM & MAG HYDROXIDE-SIMETH 200-200-20 MG/5ML PO SUSP
30.00 | ORAL | Status: DC
Start: ? — End: 2020-03-21

## 2020-03-21 MED ORDER — GENERIC EXTERNAL MEDICATION
Status: DC
Start: ? — End: 2020-03-21

## 2020-03-21 MED ORDER — FAMOTIDINE 20 MG PO TABS
20.00 | ORAL_TABLET | ORAL | Status: DC
Start: 2020-03-24 — End: 2020-03-21

## 2020-03-21 MED ORDER — FLUOXETINE HCL 20 MG PO CAPS
20.00 | ORAL_CAPSULE | ORAL | Status: DC
Start: 2020-03-25 — End: 2020-03-21

## 2020-03-21 MED ORDER — FUROSEMIDE 40 MG PO TABS
40.00 | ORAL_TABLET | ORAL | Status: DC
Start: 2020-03-22 — End: 2020-03-21

## 2020-03-21 MED ORDER — ALBUTEROL SULFATE HFA 108 (90 BASE) MCG/ACT IN AERS
2.00 | INHALATION_SPRAY | RESPIRATORY_TRACT | Status: DC
Start: ? — End: 2020-03-21

## 2020-03-21 MED ORDER — INSULIN GLARGINE 100 UNIT/ML ~~LOC~~ SOLN
1.00 | SUBCUTANEOUS | Status: DC
Start: 2020-03-24 — End: 2020-03-21

## 2020-03-21 MED ORDER — HYDRALAZINE HCL 20 MG/ML IJ SOLN
10.00 | INTRAMUSCULAR | Status: DC
Start: ? — End: 2020-03-21

## 2020-03-21 MED ORDER — AMIODARONE HCL 200 MG PO TABS
400.00 | ORAL_TABLET | ORAL | Status: DC
Start: 2020-03-24 — End: 2020-03-21

## 2020-03-21 MED ORDER — CARVEDILOL 6.25 MG PO TABS
6.25 | ORAL_TABLET | ORAL | Status: DC
Start: 2020-03-21 — End: 2020-03-21

## 2020-03-21 MED ORDER — INSULIN LISPRO 100 UNIT/ML ~~LOC~~ SOLN
1.00 | SUBCUTANEOUS | Status: DC
Start: ? — End: 2020-03-21

## 2020-03-21 MED ORDER — OXYCODONE HCL 5 MG PO TABS
5.00 | ORAL_TABLET | ORAL | Status: DC
Start: ? — End: 2020-03-21

## 2020-03-21 MED ORDER — MORPHINE SULFATE 4 MG/ML IJ SOLN
4.00 | INTRAMUSCULAR | Status: DC
Start: ? — End: 2020-03-21

## 2020-03-21 MED ORDER — BISACODYL 10 MG RE SUPP
10.00 | RECTAL | Status: DC
Start: ? — End: 2020-03-21

## 2020-03-21 MED ORDER — ACETAMINOPHEN 325 MG PO TABS
650.00 | ORAL_TABLET | ORAL | Status: DC
Start: ? — End: 2020-03-21

## 2020-03-21 MED ORDER — ALPRAZOLAM 0.25 MG PO TABS
0.50 | ORAL_TABLET | ORAL | Status: DC
Start: ? — End: 2020-03-21

## 2020-03-21 MED ORDER — GABAPENTIN 300 MG PO CAPS
300.00 | ORAL_CAPSULE | ORAL | Status: DC
Start: 2020-03-24 — End: 2020-03-21

## 2020-03-21 MED ORDER — MAGNESIUM HYDROXIDE 400 MG/5ML PO SUSP
30.00 | ORAL | Status: DC
Start: ? — End: 2020-03-21

## 2020-03-21 MED ORDER — POTASSIUM CHLORIDE CRYS ER 20 MEQ PO TBCR
20.00 | EXTENDED_RELEASE_TABLET | ORAL | Status: DC
Start: 2020-03-22 — End: 2020-03-21

## 2020-03-21 MED ORDER — DSS 100 MG PO CAPS
100.00 | ORAL_CAPSULE | ORAL | Status: DC
Start: 2020-03-24 — End: 2020-03-21

## 2020-03-21 MED ORDER — OXYCODONE HCL 5 MG PO TABS
10.00 | ORAL_TABLET | ORAL | Status: DC
Start: ? — End: 2020-03-21

## 2020-03-21 MED ORDER — SODIUM CHLORIDE 0.9 % IV SOLN
10.00 | INTRAVENOUS | Status: DC
Start: ? — End: 2020-03-21

## 2020-03-21 MED ORDER — ATORVASTATIN CALCIUM 40 MG PO TABS
40.00 | ORAL_TABLET | ORAL | Status: DC
Start: 2020-03-24 — End: 2020-03-21

## 2020-03-21 MED ORDER — ISOLYTE-S IV SOLN
INTRAVENOUS | Status: DC
Start: ? — End: 2020-03-21

## 2020-03-21 MED ORDER — INSULIN LISPRO 100 UNIT/ML ~~LOC~~ SOLN
1.00 | SUBCUTANEOUS | Status: DC
Start: 2020-03-24 — End: 2020-03-21

## 2020-03-22 DIAGNOSIS — E785 Hyperlipidemia, unspecified: Secondary | ICD-10-CM | POA: Diagnosis not present

## 2020-03-22 DIAGNOSIS — I10 Essential (primary) hypertension: Secondary | ICD-10-CM | POA: Diagnosis not present

## 2020-03-22 DIAGNOSIS — E1165 Type 2 diabetes mellitus with hyperglycemia: Secondary | ICD-10-CM | POA: Diagnosis not present

## 2020-03-22 DIAGNOSIS — I25110 Atherosclerotic heart disease of native coronary artery with unstable angina pectoris: Secondary | ICD-10-CM | POA: Diagnosis not present

## 2020-03-22 DIAGNOSIS — E669 Obesity, unspecified: Secondary | ICD-10-CM | POA: Diagnosis not present

## 2020-03-22 DIAGNOSIS — E118 Type 2 diabetes mellitus with unspecified complications: Secondary | ICD-10-CM | POA: Diagnosis not present

## 2020-03-22 DIAGNOSIS — I200 Unstable angina: Secondary | ICD-10-CM | POA: Diagnosis not present

## 2020-03-22 DIAGNOSIS — Z20822 Contact with and (suspected) exposure to covid-19: Secondary | ICD-10-CM | POA: Diagnosis not present

## 2020-03-22 DIAGNOSIS — Z6833 Body mass index (BMI) 33.0-33.9, adult: Secondary | ICD-10-CM | POA: Diagnosis not present

## 2020-03-22 DIAGNOSIS — F411 Generalized anxiety disorder: Secondary | ICD-10-CM | POA: Diagnosis not present

## 2020-03-23 MED ORDER — FUROSEMIDE 40 MG PO TABS
20.00 | ORAL_TABLET | ORAL | Status: DC
Start: 2020-03-25 — End: 2020-03-23

## 2020-03-23 MED ORDER — GENERIC EXTERNAL MEDICATION
Status: DC
Start: ? — End: 2020-03-23

## 2020-03-23 MED ORDER — BISACODYL 10 MG RE SUPP
10.00 | RECTAL | Status: DC
Start: ? — End: 2020-03-23

## 2020-03-23 MED ORDER — GENERIC EXTERNAL MEDICATION
150.00 | Status: DC
Start: ? — End: 2020-03-23

## 2020-03-23 MED ORDER — LISINOPRIL 10 MG PO TABS
10.00 | ORAL_TABLET | ORAL | Status: DC
Start: 2020-03-25 — End: 2020-03-23

## 2020-03-23 MED ORDER — POTASSIUM CHLORIDE ER 10 MEQ PO TBCR
10.00 | EXTENDED_RELEASE_TABLET | ORAL | Status: DC
Start: 2020-03-25 — End: 2020-03-23

## 2020-03-23 MED ORDER — CARVEDILOL 12.5 MG PO TABS
12.50 | ORAL_TABLET | ORAL | Status: DC
Start: 2020-03-24 — End: 2020-03-23

## 2020-03-24 ENCOUNTER — Other Ambulatory Visit (HOSPITAL_BASED_OUTPATIENT_CLINIC_OR_DEPARTMENT_OTHER): Payer: Self-pay | Admitting: Vascular Surgery

## 2020-03-24 MED ORDER — AMLODIPINE BESYLATE 5 MG PO TABS
2.50 | ORAL_TABLET | ORAL | Status: DC
Start: 2020-03-25 — End: 2020-03-24

## 2020-03-24 MED ORDER — GENERIC EXTERNAL MEDICATION
Status: DC
Start: ? — End: 2020-03-24

## 2020-03-24 MED FILL — oxyCODONE HCL 5 MG TABS: 5 | 7 days supply | Qty: 20 | Fill #0

## 2020-03-24 MED FILL — ASPIRIN EC 325 MG TABLET: 325 | 90 days supply | Qty: 90 | Fill #0

## 2020-03-24 MED FILL — DOCUSATE NA 100 MG CAP: 100 | 50 days supply | Qty: 100 | Fill #0

## 2020-03-25 ENCOUNTER — Other Ambulatory Visit: Payer: Self-pay | Admitting: *Deleted

## 2020-03-25 MED ORDER — GENERIC EXTERNAL MEDICATION
Status: DC
Start: ? — End: 2020-03-25

## 2020-03-25 NOTE — Patient Outreach (Signed)
[facility]) Care Management  03/25/2020  KEIASIA CHOICE November 08, 1956 [id number]   Transition of care telephone call  Referral received:03/21/20 Initial outreach:03/25/20 Insurance: [employer]   Initial unsuccessful telephone call to patient's preferred number in order to complete transition of care assessment; no answer, left HIPAA compliant voicemail message requesting return call.   Objective: Per the electronic medical record, Mrs.Esabella Dobrinski  was hospitalized at [facility] from 4/21-4/29/21 for CABG x 4.. Comorbidities include: Type 2 Diabetes, Hypertension, CAD, Hyperlipidemia,  She was discharged to home on 03/24/20 without the need for home health services or durable medical equipment per the discharge summary.   Plan: This RNCM will route unsuccessful outreach letter with [facility] Management pamphlet and 24 hour Nurse Advice Line Magnet to [place] Management clinical pool to be mailed to patient's home address. This RNCM will attempt another outreach within 3- 4 business days.  Joylene Draft, RN, BSN  [place] Management Coordinator  [phone]- Mobile [phone]- Toll Free Main Office

## 2020-03-28 MED FILL — FLUoxetine HCL 20 MG CAPS: 20 | 30 days supply | Qty: 30 | Fill #4

## 2020-03-28 MED FILL — ALPRAZolam 0.25 MG TABS: 0.25 | 30 days supply | Qty: 30 | Fill #2

## 2020-03-28 MED FILL — GABAPENTIN 100 MG CAPSULE: 100 | 30 days supply | Qty: 90 | Fill #1

## 2020-03-28 MED FILL — AMLODIPINE 2.5 MG TABLET: 2.5 | 30 days supply | Qty: 30 | Fill #1

## 2020-03-28 MED FILL — JARDIANCE 25 MG TABLET: 25 | 30 days supply | Qty: 30 | Fill #2

## 2020-03-28 MED FILL — TRULICITY 1.5 MG/0.5 ML PEN: 1.5 | 28 days supply | Qty: 2 | Fill #5

## 2020-03-29 MED FILL — RAMIPRIL 10 MG CAPSULE: 10 | 90 days supply | Qty: 180 | Fill #1

## 2020-03-29 MED FILL — metFORMIN HCL ER 500 MG TB2: 500 | 90 days supply | Qty: 90 | Fill #3

## 2020-03-29 MED FILL — FREESTYLE LITE TEST STRIP: 50 days supply | Qty: 50 | Fill #1

## 2020-03-30 ENCOUNTER — Other Ambulatory Visit: Payer: Self-pay | Admitting: *Deleted

## 2020-03-30 NOTE — Patient Outreach (Signed)
[place] [facility]) Care Management  03/30/2020  MI CHAP [birth year] [id number]   Transition of care call Referral received: 03/21/20 Initial outreach attempt: 03/25/20 Insurance: UMR    2nd unsuccessful telephone call to patient's preferred contact number in order to complete post hospital discharge transition of care assessment , no answer left HIPAA compliant message requesting return call.    Objective: Per the electronic medical record, Mrs.Melanie Sweeney  was hospitalized at [facility] from 4/21-4/29/21 for CABG x 4.. Comorbidities include: Type 2 Diabetes, Hypertension, CAD, Hyperlipidemia,  She was discharged to home on 03/24/20 without the need for home health services or durable medical equipment per the discharge summary.   Plan If no return call from patient will attempt 3rd outreach in the next 4 business days.    Joylene Draft, RN, BSN  [place] Management Coordinator  [phone]- Mobile [phone]- Toll Free Main Office

## 2020-04-06 ENCOUNTER — Other Ambulatory Visit: Payer: Self-pay | Admitting: *Deleted

## 2020-04-06 DIAGNOSIS — Z951 Presence of aortocoronary bypass graft: Secondary | ICD-10-CM | POA: Diagnosis not present

## 2020-04-06 NOTE — Patient Outreach (Signed)
[name]) Care Management  04/06/2020  Melanie Sweeney [birth year] [id number]   Transition of care call Referral received: 03/21/20 Initial outreach attempt: 03/25/20 Insurance: [employer] unsuccessful telephone call to patient's preferred contact number in order to complete post hospital discharge transition of care assessment; no answer, left HIPAA compliant message requesting return call.   Objective: Per the electronic medical record, Mrs.Melanie Blantonwas hospitalized at [facility] from 4/21-4/29/21 for CABG x 4.. Comorbidities include:Type 2 Diabetes, Hypertension, CAD, Hyperlipidemia, Shewas discharged to home on 4/29/21without the need for home health services or durable medical equipment per the discharge summary.    Plan: If no return call from patient, will close case to [place] Management services in 10 business days after initial post hospital discharge outreach,on 03/25/20.   Joylene Draft, RN, BSN  [place] Management Coordinator  [phone]- Mobile [phone]- Toll Free Main Office

## 2020-04-08 ENCOUNTER — Other Ambulatory Visit: Payer: Self-pay | Admitting: *Deleted

## 2020-04-08 NOTE — Patient Outreach (Signed)
[facility]) Care Management  04/08/2020  Melanie Sweeney 1956-07-20 [id number]   Transition of care /Case Closure Unsuccessful outreach    Referral received:03/21/20 Initial outreach:03/25/20 Insurance: UMR    Unable to complete post hospital discharge transition of care assessment. No return call form patient after 3 call attempts and no response to request to contact RN Care Coordinator in unsuccessful outreach letter mailed to home on  03/25/20.  Objective: Per the electronic medical record, Mrs.Melanie Blantonwas hospitalized at [facility] from 4/21-4/29/21 for CABG x 4.. Comorbidities include:Type 2 Diabetes, Hypertension, CAD, Hyperlipidemia, Shewas discharged to home on 4/29/21without the need for home health services or durable medical equipment per the discharge summary. Plan Case closed to [employer] as it has been 10 days since initial post discharge outreach attempt.   Joylene Draft, RN, BSN  [place] Management Coordinator  [phone]- Mobile [phone]- Toll Free Main Office

## 2020-04-13 DIAGNOSIS — Z09 Encounter for follow-up examination after completed treatment for conditions other than malignant neoplasm: Secondary | ICD-10-CM | POA: Diagnosis not present

## 2020-04-13 DIAGNOSIS — Z951 Presence of aortocoronary bypass graft: Secondary | ICD-10-CM | POA: Diagnosis not present

## 2020-04-16 DIAGNOSIS — Z23 Encounter for immunization: Secondary | ICD-10-CM | POA: Diagnosis not present

## 2020-04-21 DIAGNOSIS — Z48812 Encounter for surgical aftercare following surgery on the circulatory system: Secondary | ICD-10-CM | POA: Diagnosis not present

## 2020-04-21 DIAGNOSIS — Z951 Presence of aortocoronary bypass graft: Secondary | ICD-10-CM | POA: Diagnosis not present

## 2020-04-26 DIAGNOSIS — Z951 Presence of aortocoronary bypass graft: Secondary | ICD-10-CM | POA: Diagnosis not present

## 2020-04-26 DIAGNOSIS — Z48812 Encounter for surgical aftercare following surgery on the circulatory system: Secondary | ICD-10-CM | POA: Diagnosis not present

## 2020-04-28 ENCOUNTER — Other Ambulatory Visit (HOSPITAL_BASED_OUTPATIENT_CLINIC_OR_DEPARTMENT_OTHER): Payer: Self-pay | Admitting: Endocrinology

## 2020-04-28 DIAGNOSIS — Z951 Presence of aortocoronary bypass graft: Secondary | ICD-10-CM | POA: Diagnosis not present

## 2020-04-28 DIAGNOSIS — Z48812 Encounter for surgical aftercare following surgery on the circulatory system: Secondary | ICD-10-CM | POA: Diagnosis not present

## 2020-04-28 MED FILL — GABAPENTIN 100 MG CAPSULE: 100 | 30 days supply | Qty: 90 | Fill #2

## 2020-04-28 MED FILL — JARDIANCE 25 MG TABLET: 25 | 30 days supply | Qty: 30 | Fill #3

## 2020-04-28 MED FILL — TRULICITY 1.5 MG/0.5 ML PEN: 1.5 | 28 days supply | Qty: 2 | Fill #0

## 2020-04-28 MED FILL — ALPRAZolam 0.25 MG TABS: 0.25 | 30 days supply | Qty: 30 | Fill #3

## 2020-04-28 MED FILL — FLUoxetine HCL 20 MG CAPS: 20 | 30 days supply | Qty: 30 | Fill #5

## 2020-04-28 MED FILL — AMLODIPINE 2.5 MG TABLET: 2.5 | 90 days supply | Qty: 90 | Fill #2

## 2020-05-03 DIAGNOSIS — Z48812 Encounter for surgical aftercare following surgery on the circulatory system: Secondary | ICD-10-CM | POA: Diagnosis not present

## 2020-05-03 DIAGNOSIS — Z951 Presence of aortocoronary bypass graft: Secondary | ICD-10-CM | POA: Diagnosis not present

## 2020-05-05 DIAGNOSIS — Z48812 Encounter for surgical aftercare following surgery on the circulatory system: Secondary | ICD-10-CM | POA: Diagnosis not present

## 2020-05-05 DIAGNOSIS — Z951 Presence of aortocoronary bypass graft: Secondary | ICD-10-CM | POA: Diagnosis not present

## 2020-05-09 DIAGNOSIS — Z48812 Encounter for surgical aftercare following surgery on the circulatory system: Secondary | ICD-10-CM | POA: Diagnosis not present

## 2020-05-09 DIAGNOSIS — Z951 Presence of aortocoronary bypass graft: Secondary | ICD-10-CM | POA: Diagnosis not present

## 2020-05-09 MED FILL — TRESIBA FLEXTOUCH 100 UNITS: 100 | 80 days supply | Qty: 12 | Fill #0

## 2020-05-10 DIAGNOSIS — Z951 Presence of aortocoronary bypass graft: Secondary | ICD-10-CM | POA: Diagnosis not present

## 2020-05-10 DIAGNOSIS — Z48812 Encounter for surgical aftercare following surgery on the circulatory system: Secondary | ICD-10-CM | POA: Diagnosis not present

## 2020-05-10 MED FILL — CARVEDILOL 12.5 MG TABLET: 12.5 | 90 days supply | Qty: 180 | Fill #0

## 2020-05-12 DIAGNOSIS — F5102 Adjustment insomnia: Secondary | ICD-10-CM | POA: Diagnosis not present

## 2020-05-23 DIAGNOSIS — Z951 Presence of aortocoronary bypass graft: Secondary | ICD-10-CM | POA: Diagnosis not present

## 2020-05-23 DIAGNOSIS — Z48812 Encounter for surgical aftercare following surgery on the circulatory system: Secondary | ICD-10-CM | POA: Diagnosis not present

## 2020-05-24 DIAGNOSIS — E782 Mixed hyperlipidemia: Secondary | ICD-10-CM | POA: Diagnosis not present

## 2020-05-24 DIAGNOSIS — Z951 Presence of aortocoronary bypass graft: Secondary | ICD-10-CM | POA: Diagnosis not present

## 2020-05-24 DIAGNOSIS — Z48812 Encounter for surgical aftercare following surgery on the circulatory system: Secondary | ICD-10-CM | POA: Diagnosis not present

## 2020-05-26 DIAGNOSIS — Z48812 Encounter for surgical aftercare following surgery on the circulatory system: Secondary | ICD-10-CM | POA: Diagnosis not present

## 2020-05-26 DIAGNOSIS — Z951 Presence of aortocoronary bypass graft: Secondary | ICD-10-CM | POA: Diagnosis not present

## 2020-05-27 DIAGNOSIS — E559 Vitamin D deficiency, unspecified: Secondary | ICD-10-CM | POA: Diagnosis not present

## 2020-05-27 DIAGNOSIS — E7801 Familial hypercholesterolemia: Secondary | ICD-10-CM | POA: Diagnosis not present

## 2020-05-27 DIAGNOSIS — E6609 Other obesity due to excess calories: Secondary | ICD-10-CM | POA: Diagnosis not present

## 2020-05-27 DIAGNOSIS — E118 Type 2 diabetes mellitus with unspecified complications: Secondary | ICD-10-CM | POA: Diagnosis not present

## 2020-05-27 DIAGNOSIS — Z794 Long term (current) use of insulin: Secondary | ICD-10-CM | POA: Diagnosis not present

## 2020-05-27 DIAGNOSIS — Z951 Presence of aortocoronary bypass graft: Secondary | ICD-10-CM | POA: Diagnosis not present

## 2020-05-27 DIAGNOSIS — I2510 Atherosclerotic heart disease of native coronary artery without angina pectoris: Secondary | ICD-10-CM | POA: Diagnosis not present

## 2020-05-27 DIAGNOSIS — E113399 Type 2 diabetes mellitus with moderate nonproliferative diabetic retinopathy without macular edema, unspecified eye: Secondary | ICD-10-CM | POA: Diagnosis not present

## 2020-05-27 DIAGNOSIS — I10 Essential (primary) hypertension: Secondary | ICD-10-CM | POA: Diagnosis not present

## 2020-05-27 MED FILL — ALPRAZolam 0.25 MG TABS: 0.25 | 30 days supply | Qty: 30 | Fill #4

## 2020-05-27 MED FILL — TRULICITY 1.5 MG/0.5 ML PEN: 1.5 | 28 days supply | Qty: 2 | Fill #1

## 2020-05-27 MED FILL — JARDIANCE 25 MG TABLET: 25 | 30 days supply | Qty: 30 | Fill #4

## 2020-05-27 MED FILL — GABAPENTIN 100 MG CAPSULE: 100 | 30 days supply | Qty: 90 | Fill #3

## 2020-06-04 DIAGNOSIS — E118 Type 2 diabetes mellitus with unspecified complications: Secondary | ICD-10-CM | POA: Diagnosis not present

## 2020-06-04 DIAGNOSIS — E7801 Familial hypercholesterolemia: Secondary | ICD-10-CM | POA: Diagnosis not present

## 2020-06-04 DIAGNOSIS — I10 Essential (primary) hypertension: Secondary | ICD-10-CM | POA: Diagnosis not present

## 2020-06-04 DIAGNOSIS — Z794 Long term (current) use of insulin: Secondary | ICD-10-CM | POA: Diagnosis not present

## 2020-06-04 DIAGNOSIS — E559 Vitamin D deficiency, unspecified: Secondary | ICD-10-CM | POA: Diagnosis not present

## 2020-06-06 DIAGNOSIS — Z48812 Encounter for surgical aftercare following surgery on the circulatory system: Secondary | ICD-10-CM | POA: Diagnosis not present

## 2020-06-06 DIAGNOSIS — Z951 Presence of aortocoronary bypass graft: Secondary | ICD-10-CM | POA: Diagnosis not present

## 2020-06-07 DIAGNOSIS — Z48812 Encounter for surgical aftercare following surgery on the circulatory system: Secondary | ICD-10-CM | POA: Diagnosis not present

## 2020-06-07 DIAGNOSIS — Z951 Presence of aortocoronary bypass graft: Secondary | ICD-10-CM | POA: Diagnosis not present

## 2020-06-09 DIAGNOSIS — Z951 Presence of aortocoronary bypass graft: Secondary | ICD-10-CM | POA: Diagnosis not present

## 2020-06-09 DIAGNOSIS — Z48812 Encounter for surgical aftercare following surgery on the circulatory system: Secondary | ICD-10-CM | POA: Diagnosis not present

## 2020-06-13 DIAGNOSIS — Z48812 Encounter for surgical aftercare following surgery on the circulatory system: Secondary | ICD-10-CM | POA: Diagnosis not present

## 2020-06-13 DIAGNOSIS — Z951 Presence of aortocoronary bypass graft: Secondary | ICD-10-CM | POA: Diagnosis not present

## 2020-06-14 DIAGNOSIS — Z48812 Encounter for surgical aftercare following surgery on the circulatory system: Secondary | ICD-10-CM | POA: Diagnosis not present

## 2020-06-14 DIAGNOSIS — Z951 Presence of aortocoronary bypass graft: Secondary | ICD-10-CM | POA: Diagnosis not present

## 2020-06-21 DIAGNOSIS — Z951 Presence of aortocoronary bypass graft: Secondary | ICD-10-CM | POA: Diagnosis not present

## 2020-06-21 DIAGNOSIS — Z48812 Encounter for surgical aftercare following surgery on the circulatory system: Secondary | ICD-10-CM | POA: Diagnosis not present

## 2020-06-23 DIAGNOSIS — Z951 Presence of aortocoronary bypass graft: Secondary | ICD-10-CM | POA: Diagnosis not present

## 2020-06-23 DIAGNOSIS — Z48812 Encounter for surgical aftercare following surgery on the circulatory system: Secondary | ICD-10-CM | POA: Diagnosis not present

## 2020-06-27 MED FILL — JARDIANCE 25 MG TABLET: 25 | 30 days supply | Qty: 30 | Fill #5

## 2020-06-27 MED FILL — ASPIRIN EC 325 MG TABLET: 325 | 90 days supply | Qty: 90 | Fill #1

## 2020-06-27 MED FILL — TRULICITY 1.5 MG/0.5 ML PEN: 1.5 | 28 days supply | Qty: 2 | Fill #2

## 2020-06-27 MED FILL — GABAPENTIN 100 MG CAPSULE: 100 | 30 days supply | Qty: 90 | Fill #4

## 2020-06-28 MED FILL — FLUoxetine HCL 20 MG CAPS: 20 | 30 days supply | Qty: 30 | Fill #0

## 2020-06-28 MED FILL — ALPRAZolam 0.25 MG TABS: 0.25 | 30 days supply | Qty: 30 | Fill #0

## 2020-07-08 MED FILL — RAMIPRIL 10 MG CAPSULE: 10 | 90 days supply | Qty: 180 | Fill #0

## 2020-07-09 DIAGNOSIS — E7800 Pure hypercholesterolemia, unspecified: Secondary | ICD-10-CM | POA: Diagnosis not present

## 2020-07-11 MED FILL — FREESTYLE LITE TEST STRIP: 50 days supply | Qty: 50 | Fill #2

## 2020-07-11 MED FILL — PRAVASTATIN NA 40 MG TAB: 40 | 90 days supply | Qty: 90 | Fill #0

## 2020-07-11 MED FILL — metFORMIN HCL ER 500 MG TB2: 500 | 90 days supply | Qty: 90 | Fill #0

## 2020-07-20 DIAGNOSIS — E118 Type 2 diabetes mellitus with unspecified complications: Secondary | ICD-10-CM | POA: Diagnosis not present

## 2020-07-20 DIAGNOSIS — Z794 Long term (current) use of insulin: Secondary | ICD-10-CM | POA: Diagnosis not present

## 2020-07-20 DIAGNOSIS — I2510 Atherosclerotic heart disease of native coronary artery without angina pectoris: Secondary | ICD-10-CM | POA: Diagnosis not present

## 2020-07-26 DIAGNOSIS — R918 Other nonspecific abnormal finding of lung field: Secondary | ICD-10-CM | POA: Diagnosis not present

## 2020-07-26 DIAGNOSIS — K228 Other specified diseases of esophagus: Secondary | ICD-10-CM | POA: Diagnosis not present

## 2020-07-27 MED FILL — JARDIANCE 25 MG TABLET: 25 | 30 days supply | Qty: 30 | Fill #6

## 2020-07-27 MED FILL — TRULICITY 1.5 MG/0.5 ML PEN: 1.5 | 28 days supply | Qty: 2 | Fill #3

## 2020-07-27 MED FILL — GABAPENTIN 100 MG CAPSULE: 100 | 30 days supply | Qty: 90 | Fill #5

## 2020-07-27 MED FILL — ALPRAZolam 0.25 MG TABS: 0.25 | 30 days supply | Qty: 30 | Fill #1

## 2020-07-27 MED FILL — TRESIBA FLEXTOUCH 100 UNITS: 100 | 80 days supply | Qty: 12 | Fill #1

## 2020-07-27 MED FILL — FLUoxetine HCL 20 MG CAPS: 20 | 30 days supply | Qty: 30 | Fill #1

## 2020-08-16 MED FILL — CARVEDILOL 12.5 MG TABLET: 12.5 | 90 days supply | Qty: 180 | Fill #1

## 2020-08-16 MED FILL — AMLODIPINE 2.5 MG TABLET: 2.5 | 30 days supply | Qty: 30 | Fill #3

## 2020-08-29 ENCOUNTER — Other Ambulatory Visit (HOSPITAL_BASED_OUTPATIENT_CLINIC_OR_DEPARTMENT_OTHER): Payer: Self-pay | Admitting: Internal Medicine

## 2020-08-29 MED FILL — JARDIANCE 25 MG TABLET: 25 | 30 days supply | Qty: 30 | Fill #7

## 2020-08-29 MED FILL — FLUoxetine HCL 20 MG CAPS: 20 | 30 days supply | Qty: 30 | Fill #2

## 2020-08-29 MED FILL — FLUAD QUADRIVALENT 0.5 ML P: 0.5 | 1 days supply | Qty: 1 | Fill #0

## 2020-08-29 MED FILL — GABAPENTIN 100 MG CAPSULE: 100 | 90 days supply | Qty: 270 | Fill #0

## 2020-08-29 MED FILL — ALPRAZolam 0.25 MG TABS: 0.25 | 30 days supply | Qty: 30 | Fill #2

## 2020-08-29 MED FILL — TRULICITY 1.5 MG/0.5 ML PEN: 1.5 | 28 days supply | Qty: 2 | Fill #4

## 2020-09-23 MED FILL — JARDIANCE 25 MG TABLET: 25 | 30 days supply | Qty: 30 | Fill #8

## 2020-09-23 MED FILL — TRULICITY 1.5 MG/0.5 ML PEN: 1.5 | 28 days supply | Qty: 2 | Fill #5

## 2020-09-23 MED FILL — FLUoxetine HCL 20 MG CAPS: 20 | 30 days supply | Qty: 30 | Fill #3

## 2020-09-23 MED FILL — ASPIRIN EC 325 MG TABLET: 325 | 90 days supply | Qty: 90 | Fill #2

## 2020-10-26 DIAGNOSIS — J218 Acute bronchiolitis due to other specified organisms: Secondary | ICD-10-CM | POA: Diagnosis not present

## 2020-10-26 DIAGNOSIS — B9789 Other viral agents as the cause of diseases classified elsewhere: Secondary | ICD-10-CM | POA: Diagnosis not present
